# Patient Record
Sex: Male | Born: 1960 | Race: Black or African American | Hispanic: No | Marital: Single | Smoking: Former smoker
Health system: Southern US, Community
[De-identification: ages and names within clinical notes are randomized; demographics above are authoritative.]

## PROBLEM LIST (undated history)

## (undated) DIAGNOSIS — M1990 Unspecified osteoarthritis, unspecified site: Secondary | ICD-10-CM

## (undated) DIAGNOSIS — J45909 Unspecified asthma, uncomplicated: Secondary | ICD-10-CM

## (undated) DIAGNOSIS — A159 Respiratory tuberculosis unspecified: Secondary | ICD-10-CM

## (undated) DIAGNOSIS — I10 Essential (primary) hypertension: Secondary | ICD-10-CM

## (undated) HISTORY — DX: Respiratory tuberculosis unspecified: A15.9

## (undated) HISTORY — PX: OTHER SURGICAL HISTORY: SHX169

## (undated) HISTORY — PX: HERNIA REPAIR: SHX51

---

## 1997-07-20 ENCOUNTER — Emergency Department (HOSPITAL_COMMUNITY): Admission: EM | Admit: 1997-07-20 | Discharge: 1997-07-20 | Payer: Self-pay | Admitting: *Deleted

## 1997-07-30 ENCOUNTER — Emergency Department (HOSPITAL_COMMUNITY): Admission: EM | Admit: 1997-07-30 | Discharge: 1997-07-30 | Payer: Self-pay | Admitting: Emergency Medicine

## 1998-10-06 ENCOUNTER — Emergency Department (HOSPITAL_COMMUNITY): Admission: EM | Admit: 1998-10-06 | Discharge: 1998-10-06 | Payer: Self-pay | Admitting: Emergency Medicine

## 2001-10-15 ENCOUNTER — Emergency Department (HOSPITAL_COMMUNITY): Admission: EM | Admit: 2001-10-15 | Discharge: 2001-10-15 | Payer: Self-pay

## 2001-12-29 ENCOUNTER — Encounter: Payer: Self-pay | Admitting: Emergency Medicine

## 2001-12-29 ENCOUNTER — Emergency Department (HOSPITAL_COMMUNITY): Admission: EM | Admit: 2001-12-29 | Discharge: 2001-12-29 | Payer: Self-pay | Admitting: Emergency Medicine

## 2003-02-08 ENCOUNTER — Emergency Department (HOSPITAL_COMMUNITY): Admission: EM | Admit: 2003-02-08 | Discharge: 2003-02-08 | Payer: Self-pay | Admitting: Emergency Medicine

## 2003-12-18 ENCOUNTER — Emergency Department (HOSPITAL_COMMUNITY): Admission: EM | Admit: 2003-12-18 | Discharge: 2003-12-19 | Payer: Self-pay | Admitting: Emergency Medicine

## 2004-08-06 ENCOUNTER — Emergency Department (HOSPITAL_COMMUNITY): Admission: EM | Admit: 2004-08-06 | Discharge: 2004-08-06 | Payer: Self-pay | Admitting: Emergency Medicine

## 2004-10-02 ENCOUNTER — Emergency Department (HOSPITAL_COMMUNITY): Admission: EM | Admit: 2004-10-02 | Discharge: 2004-10-02 | Payer: Self-pay | Admitting: Emergency Medicine

## 2004-11-06 ENCOUNTER — Emergency Department (HOSPITAL_COMMUNITY): Admission: EM | Admit: 2004-11-06 | Discharge: 2004-11-06 | Payer: Self-pay | Admitting: Emergency Medicine

## 2004-11-20 ENCOUNTER — Emergency Department (HOSPITAL_COMMUNITY): Admission: EM | Admit: 2004-11-20 | Discharge: 2004-11-20 | Payer: Self-pay | Admitting: *Deleted

## 2007-10-09 ENCOUNTER — Emergency Department (HOSPITAL_COMMUNITY): Admission: EM | Admit: 2007-10-09 | Discharge: 2007-10-09 | Payer: Self-pay | Admitting: Emergency Medicine

## 2008-06-28 ENCOUNTER — Emergency Department (HOSPITAL_COMMUNITY): Admission: EM | Admit: 2008-06-28 | Discharge: 2008-06-28 | Payer: Self-pay | Admitting: Emergency Medicine

## 2008-12-18 ENCOUNTER — Emergency Department (HOSPITAL_COMMUNITY): Admission: EM | Admit: 2008-12-18 | Discharge: 2008-12-18 | Payer: Self-pay | Admitting: Emergency Medicine

## 2009-04-02 ENCOUNTER — Observation Stay (HOSPITAL_COMMUNITY): Admission: EM | Admit: 2009-04-02 | Discharge: 2009-04-03 | Payer: Self-pay | Admitting: Emergency Medicine

## 2010-01-30 ENCOUNTER — Emergency Department (HOSPITAL_COMMUNITY): Admission: EM | Admit: 2010-01-30 | Discharge: 2010-01-30 | Payer: Self-pay | Admitting: Emergency Medicine

## 2010-01-31 ENCOUNTER — Emergency Department (HOSPITAL_COMMUNITY): Admission: EM | Admit: 2010-01-31 | Discharge: 2010-02-01 | Payer: Self-pay | Admitting: Emergency Medicine

## 2010-06-05 LAB — CBC
HCT: 43.3 % (ref 39.0–52.0)
Hemoglobin: 15 g/dL (ref 13.0–17.0)
MCV: 91.3 fL (ref 78.0–100.0)
Platelets: 194 10*3/uL (ref 150–400)
WBC: 5.9 10*3/uL (ref 4.0–10.5)

## 2010-06-05 LAB — DIFFERENTIAL
Basophils Relative: 0 % (ref 0–1)
Eosinophils Relative: 1 % (ref 0–5)
Eosinophils Relative: 1 % (ref 0–5)
Lymphocytes Relative: 46 % (ref 12–46)
Lymphocytes Relative: 46 % (ref 12–46)
Lymphs Abs: 2.6 10*3/uL (ref 0.7–4.0)
Monocytes Absolute: 0.5 10*3/uL (ref 0.1–1.0)
Neutro Abs: 2.6 10*3/uL (ref 1.7–7.7)
Neutro Abs: 2.6 10*3/uL (ref 1.7–7.7)
Neutrophils Relative %: 46 % (ref 43–77)

## 2010-06-05 LAB — URINALYSIS, ROUTINE W REFLEX MICROSCOPIC
Bilirubin Urine: NEGATIVE
Glucose, UA: NEGATIVE mg/dL
Ketones, ur: NEGATIVE mg/dL
Nitrite: NEGATIVE
Specific Gravity, Urine: 1.024 (ref 1.005–1.030)
Urobilinogen, UA: 1 mg/dL (ref 0.0–1.0)
pH: 6 (ref 5.0–8.0)

## 2010-06-05 LAB — BASIC METABOLIC PANEL
BUN: 9 mg/dL (ref 6–23)
CO2: 25 mEq/L (ref 19–32)
GFR calc Af Amer: >60 mL/min (ref >60)
GFR calc non Af Amer: >60 mL/min (ref >60)
Glucose, Bld: 106 mg/dL — ABNORMAL HIGH (ref 70–99)
Potassium: 4.2 mEq/L (ref 3.5–5.1)
Sodium: 137 mEq/L (ref 135–145)

## 2010-06-05 LAB — RAPID URINE DRUG SCREEN, HOSP PERFORMED
Benzodiazepines: NOT DETECTED
Cocaine: POSITIVE — AB
Tetrahydrocannabinol: POSITIVE — AB

## 2010-06-10 LAB — GC/CHLAMYDIA PROBE AMP, GENITAL
Chlamydia, DNA Probe: NEGATIVE
GC Probe Amp, Genital: NEGATIVE

## 2010-06-10 LAB — CBC
HCT: 44.5 % (ref 39.0–52.0)
Hemoglobin: 15.3 g/dL (ref 13.0–17.0)
MCHC: 34.4 g/dL (ref 30.0–36.0)
MCV: 93.9 fL (ref 78.0–100.0)
Platelets: 153 K/uL (ref 150–400)
RBC: 4.74 MIL/uL (ref 4.22–5.81)
RDW: 13.3 % (ref 11.5–15.5)
WBC: 9.4 10*3/uL (ref 4.0–10.5)

## 2010-06-10 LAB — DIFFERENTIAL
Basophils Absolute: 0 K/uL (ref 0.0–0.1)
Basophils Relative: 0 % (ref 0–1)
Eosinophils Absolute: 0 K/uL (ref 0.0–0.7)
Eosinophils Relative: 0 % (ref 0–5)
Lymphocytes Relative: 11 % — ABNORMAL LOW (ref 12–46)
Lymphs Abs: 1 K/uL (ref 0.7–4.0)
Monocytes Absolute: 0.9 K/uL (ref 0.1–1.0)
Monocytes Relative: 9 % (ref 3–12)
Neutro Abs: 7.4 K/uL (ref 1.7–7.7)
Neutrophils Relative %: 79 % — ABNORMAL HIGH (ref 43–77)

## 2010-06-10 LAB — RAPID URINE DRUG SCREEN, HOSP PERFORMED
Amphetamines: NOT DETECTED
Barbiturates: NOT DETECTED
Benzodiazepines: NOT DETECTED
Cocaine: NOT DETECTED
Opiates: NOT DETECTED
Tetrahydrocannabinol: POSITIVE — AB

## 2010-06-10 LAB — BASIC METABOLIC PANEL
CO2: 24 mEq/L (ref 19–32)
Calcium: 8.8 mg/dL (ref 8.4–10.5)
Chloride: 104 mEq/L (ref 96–112)
Creatinine, Ser: 0.96 mg/dL (ref 0.4–1.5)
GFR calc Af Amer: >60 mL/min (ref >60)
GFR calc non Af Amer: >60 mL/min (ref >60)
Glucose, Bld: 107 mg/dL — ABNORMAL HIGH (ref 70–99)

## 2010-06-10 LAB — BASIC METABOLIC PANEL WITH GFR
BUN: 5 mg/dL — ABNORMAL LOW (ref 6–23)
Potassium: 3.4 meq/L — ABNORMAL LOW (ref 3.5–5.1)
Sodium: 136 meq/L (ref 135–145)

## 2010-06-10 LAB — URINE CULTURE
Colony Count: NO GROWTH
Culture: NO GROWTH

## 2010-06-10 LAB — URINALYSIS, ROUTINE W REFLEX MICROSCOPIC
Glucose, UA: NEGATIVE mg/dL
Ketones, ur: NEGATIVE mg/dL
Nitrite: NEGATIVE
Protein, ur: 100 mg/dL — AB
pH: 7.5 (ref 5.0–8.0)

## 2010-06-10 LAB — URINE MICROSCOPIC-ADD ON

## 2011-04-30 ENCOUNTER — Other Ambulatory Visit: Payer: Self-pay

## 2011-04-30 ENCOUNTER — Encounter (HOSPITAL_COMMUNITY): Payer: Self-pay | Admitting: *Deleted

## 2011-04-30 ENCOUNTER — Emergency Department (HOSPITAL_COMMUNITY): Payer: Self-pay

## 2011-04-30 ENCOUNTER — Observation Stay (HOSPITAL_COMMUNITY): Payer: Self-pay

## 2011-04-30 ENCOUNTER — Observation Stay (HOSPITAL_COMMUNITY)
Admission: EM | Admit: 2011-04-30 | Discharge: 2011-04-30 | Disposition: A | Discharge status: Home / Self Care | Payer: Self-pay | Attending: Emergency Medicine | Admitting: Emergency Medicine

## 2011-04-30 DIAGNOSIS — F17200 Nicotine dependence, unspecified, uncomplicated: Secondary | ICD-10-CM | POA: Insufficient documentation

## 2011-04-30 DIAGNOSIS — R079 Chest pain, unspecified: Principal | ICD-10-CM | POA: Insufficient documentation

## 2011-04-30 DIAGNOSIS — R0602 Shortness of breath: Secondary | ICD-10-CM | POA: Insufficient documentation

## 2011-04-30 LAB — CBC
HCT: 44.5 % (ref 39.0–52.0)
Hemoglobin: 15.1 g/dL (ref 13.0–17.0)
MCH: 31.5 pg (ref 26.0–34.0)
MCV: 92.7 fL (ref 78.0–100.0)
Platelets: 205 10*3/uL (ref 150–400)
RBC: 4.8 MIL/uL (ref 4.22–5.81)

## 2011-04-30 LAB — BASIC METABOLIC PANEL
BUN: 8 mg/dL (ref 6–23)
CO2: 24 mEq/L (ref 19–32)
Calcium: 9.4 mg/dL (ref 8.4–10.5)
Creatinine, Ser: 0.84 mg/dL (ref 0.50–1.35)
Glucose, Bld: 88 mg/dL (ref 70–99)

## 2011-04-30 MED ORDER — NITROGLYCERIN 0.4 MG SL SUBL
0.4000 mg | SUBLINGUAL_TABLET | SUBLINGUAL | Status: AC | PRN
  Administered 2011-04-30 (×3): 0.4 mg via SUBLINGUAL
  Filled 2011-04-30: qty 50
  Filled 2011-04-30: qty 25

## 2011-04-30 MED ORDER — IOHEXOL 350 MG/ML SOLN
80.0000 mL | Freq: Once | INTRAVENOUS | Status: AC | PRN
  Administered 2011-04-30: 80 mL via INTRAVENOUS

## 2011-04-30 MED ORDER — METOPROLOL TARTRATE 1 MG/ML IV SOLN
INTRAVENOUS | Status: AC
  Administered 2011-04-30: 5 mg
  Filled 2011-04-30: qty 10

## 2011-04-30 MED ORDER — ONDANSETRON HCL 4 MG/2ML IJ SOLN: 4.0000 mg | Freq: Once | INTRAMUSCULAR | Status: DC

## 2011-04-30 MED ORDER — ASPIRIN 81 MG PO CHEW
324.0000 mg | CHEWABLE_TABLET | Freq: Once | ORAL | Status: AC
  Administered 2011-04-30: 324 mg via ORAL
  Filled 2011-04-30: qty 4

## 2011-04-30 MED ORDER — MORPHINE SULFATE 4 MG/ML IJ SOLN
4.0000 mg | Freq: Once | INTRAMUSCULAR | Status: AC
  Administered 2011-04-30: 4 mg via INTRAVENOUS
  Filled 2011-04-30: qty 1

## 2011-04-30 MED ORDER — DIPHENHYDRAMINE HCL 50 MG/ML IJ SOLN
INTRAMUSCULAR | Status: AC
  Filled 2011-04-30: qty 1

## 2011-04-30 MED ORDER — DIPHENHYDRAMINE HCL 50 MG/ML IJ SOLN
25.0000 mg | Freq: Once | INTRAMUSCULAR | Status: AC
  Administered 2011-04-30: 25 mg via INTRAVENOUS

## 2011-04-30 MED ORDER — METOPROLOL TARTRATE 25 MG PO TABS
ORAL_TABLET | ORAL | Status: AC
  Administered 2011-04-30: 100 mg
  Filled 2011-04-30: qty 4

## 2011-04-30 MED ORDER — NITROGLYCERIN 0.4 MG SL SUBL
SUBLINGUAL_TABLET | SUBLINGUAL | Status: AC
  Administered 2011-04-30: 0.4 mg
  Filled 2011-04-30: qty 25

## 2011-04-30 NOTE — ED Notes (Signed)
Pt reports daily nausea and vomiting for the last several months. States yesterday while working out in the cold exerting himself he suddenly felt like someone had punched him in the chest. Described as heaviness on the left side. Pt reports he felt nauseous, dizzy and needing to sit down with the pain. Pt reports pain subsided with rest. Then this morning patient reported pain again came back as heaviness which woke him out of a sleep. Pt reports he also vomited this AM with the pain. Pt denies any abdominal pain. At present pain is 6/10 patient lying in bed.

## 2011-04-30 NOTE — ED Notes (Signed)
Returned from CT by RN.

## 2011-04-30 NOTE — ED Notes (Signed)
Patient transported to CT for CCTA by RN.

## 2011-04-30 NOTE — ED Notes (Signed)
BMI 24.4 

## 2011-04-30 NOTE — ED Provider Notes (Signed)
History     CSN: [id number]  Arrival date & time 04/30/11  1047   First MD Initiated Contact with Patient 04/30/11 1125      Chief Complaint  Patient presents with  . Chest Pain  . Shortness of Breath    (Consider location/radiation/quality/duration/timing/severity/associated sxs/prior treatment) HPI  Pt presents to the ED with complaints of chest pain that started yesterday at work, when he states "his nerves started to act up and he felt shakey while having the chest pain". The pain went away last night and then woke him up again this morning. After the pain continued to come and go throughout the day the patient decided to come to the emergency department to have it looked at. The patient denies a history of cardiac problems or previous workup. The patient is a smoker but denies hypertension and hyperlipidemia as well as diabetes. The patient describes the chest pain as burning mid chest. The patient denies any nausea outside of his normal, he states that for years he vomits every morning when he wakes up. Patient's EKG at triage is normal  History reviewed. No pertinent past medical history.  Past Surgical History  Procedure Date  . Hernia repair     No family history on file.  History  Substance Use Topics  . Smoking status: Current Everyday Smoker  . Smokeless tobacco: Not on file  . Alcohol Use: Yes     occ      Review of Systems  All other systems reviewed and are negative.    Allergies  Review of patient's allergies indicates no known allergies.  Home Medications  No current outpatient prescriptions on file.  BP 124/87  Pulse 81  Temp(Src) 98.1 F (36.7 C) (Oral)  Resp 16  SpO2 99%  Physical Exam  Nursing note and vitals reviewed. Constitutional: He is oriented to person, place, and time. He appears well-developed and well-nourished.  HENT:  Head: Normocephalic and atraumatic.  Eyes: EOM are normal. Pupils are equal, round, and reactive to light.    Neck: Normal range of motion.  Cardiovascular: Normal rate, regular rhythm and normal heart sounds.   Pulmonary/Chest: Effort normal.  Abdominal: Soft.  Musculoskeletal: Normal range of motion.  Neurological: He is alert and oriented to person, place, and time.  Skin: Skin is warm and dry.    ED Course  Procedures (including critical care time)   Labs Reviewed  CBC  BASIC METABOLIC PANEL  TROPONIN I   Chest Portable 1 View  04/30/2011  *RADIOLOGY REPORT*  Clinical Data: Smoker with mid chest pain.  PORTABLE CHEST - 1 VIEW 04/30/2011 1234 hours:  Comparison: None.  Findings: Cardiac silhouette normal and mediastinal contours unremarkable for the AP portable technique.  Lungs clear. Pulmonary vascularity normal.  Bronchovascular markings normal.  No pneumothorax.  No pleural effusions.  IMPRESSION: No acute cardiopulmonary disease.  Original Report Authenticated By: Arnell Sieving, M.D.     1. Chest pain       MDM    Date: 04/30/2011  Rate: 78  Rhythm: normal sinus rhythm  QRS Axis: normal  Intervals: normal  ST/T Wave abnormalities: normal  Conduction Disutrbances:left anterior fascicular block  Narrative Interpretation:   Old EKG Reviewed: none available  Repeat EKG   Date: 04/30/2011  Rate: 72  Rhythm: normal sinus rhythm  QRS Axis: left  Intervals: normal  ST/T Wave abnormalities: normal  Conduction Disutrbances:none  Narrative Interpretation:   Old EKG Reviewed: unchanged    Pt work up  so far is benign with a negative troponin, no acute findings on EKG or chest xray. The patient is pain free after 3 nitro and 324 of aspirin. Pt has low risk factor for cardiac event and does not meet requirements to be PERC negative due to his age however, suspicion is very low for a PE.   Pt discussed and evaluated by Dr. Hyman Hopes, and we both agree that patient is a good candidate for Chest Pain Protocol with a stress test in the AM. Pt is currently chest pain free. Pt  moved to CDU and care handed off to [facility], PA-C  Repeat Troponin scheduled for 3;00pm patient will need 6 hour Troponin at 6pm.          Dorthula Matas, PA 04/30/11 1525

## 2011-04-30 NOTE — ED Notes (Signed)
Anitra Lauth, MD signed EKGs

## 2011-04-30 NOTE — ED Notes (Signed)
Per Fayrene Helper, PA, pt may eat.

## 2011-04-30 NOTE — ED Notes (Signed)
Pt is here with chest pain and sob that started yesterday at work.  Went away last nite and then came back this am and the pain woke him up.

## 2011-04-30 NOTE — ED Notes (Signed)
Spoke w/Sarah, CT Tech, who advised cardiac CT can be performed today if provider chooses to do so.  Marlon Pel, PA, aware.

## 2011-04-30 NOTE — ED Notes (Signed)
Per [place], [place], change stress echo to cardiac CT.  Per Dr Amil Amen, administer Metoprolol 100mg  and will perform test today.

## 2011-04-30 NOTE — ED Provider Notes (Signed)
Assume patient care from Patrick Estrada, [place]. A 51 year old male currently in CDU for chest pain protocol. Plan to perform coronary CT for further evaluation. Patient is currently chest pain-free. First set of troponin is negative.  5:34 PM Patient is currently in no acute distress, and is chest pain-free. On exam, he is alert and oriented and in no acute distress. Heart is with regular rate and rhythm. Lungs with mild rhonchi heard diffusely but no wheezes or rales. Abdomen is soft and nontender. I spoke with radiologist who has performed a coronary CT scan. Patient has apparently nonobstructive coronary artery disease with a calciums score in the 20s and  is 61 percentile on SEMA scale.  I recommend for patient to follow up with the primary care Dr. for further management of his condition. It is likely that his chest pain is not related to his heart at this time. However time was spent discussing smoking cessation, although with taking baby aspirin on a daily basis. Patient was understanding and agree with plan.  Fayrene Helper, PA-C 04/30/11 [phone]

## 2011-04-30 NOTE — ED Provider Notes (Signed)
Medical screening examination/treatment/procedure(s) were performed by non-physician practitioner and as supervising physician I was immediately available for consultation/collaboration.   Loren Racer, MD 04/30/11 2351

## 2011-05-01 NOTE — ED Provider Notes (Signed)
Medical screening examination/treatment/procedure(s) were conducted as a shared visit with non-physician practitioner(s) and myself.  I personally evaluated the patient during the encounter  RRR. No chest pain during my evaluation. See PA note for full details. First set CE negative and CP free. To be moved to CDU under CP protocol.  Forbes Cellar, MD 05/01/11 [phone]

## 2011-10-12 ENCOUNTER — Encounter (HOSPITAL_COMMUNITY): Payer: Self-pay | Admitting: *Deleted

## 2011-10-12 ENCOUNTER — Emergency Department (HOSPITAL_COMMUNITY): Payer: Self-pay

## 2011-10-12 ENCOUNTER — Observation Stay (HOSPITAL_COMMUNITY)
Admission: EM | Admit: 2011-10-12 | Discharge: 2011-10-14 | Disposition: A | Discharge status: Home / Self Care | Payer: MEDICAID | Attending: General Surgery | Admitting: General Surgery

## 2011-10-12 DIAGNOSIS — G8310 Monoplegia of lower limb affecting unspecified side: Secondary | ICD-10-CM | POA: Diagnosis present

## 2011-10-12 DIAGNOSIS — R109 Unspecified abdominal pain: Secondary | ICD-10-CM

## 2011-10-12 DIAGNOSIS — N289 Disorder of kidney and ureter, unspecified: Secondary | ICD-10-CM | POA: Insufficient documentation

## 2011-10-12 DIAGNOSIS — I10 Essential (primary) hypertension: Secondary | ICD-10-CM

## 2011-10-12 DIAGNOSIS — W11XXXA Fall on and from ladder, initial encounter: Secondary | ICD-10-CM | POA: Insufficient documentation

## 2011-10-12 DIAGNOSIS — S20219A Contusion of unspecified front wall of thorax, initial encounter: Secondary | ICD-10-CM

## 2011-10-12 DIAGNOSIS — F1010 Alcohol abuse, uncomplicated: Secondary | ICD-10-CM | POA: Insufficient documentation

## 2011-10-12 DIAGNOSIS — S301XXA Contusion of abdominal wall, initial encounter: Principal | ICD-10-CM | POA: Insufficient documentation

## 2011-10-12 DIAGNOSIS — M549 Dorsalgia, unspecified: Secondary | ICD-10-CM | POA: Insufficient documentation

## 2011-10-12 DIAGNOSIS — W19XXXA Unspecified fall, initial encounter: Secondary | ICD-10-CM

## 2011-10-12 DIAGNOSIS — Z7289 Other problems related to lifestyle: Secondary | ICD-10-CM

## 2011-10-12 DIAGNOSIS — Z720 Tobacco use: Secondary | ICD-10-CM | POA: Insufficient documentation

## 2011-10-12 HISTORY — DX: Essential (primary) hypertension: I10

## 2011-10-12 LAB — CBC WITH DIFFERENTIAL/PLATELET
Basophils Relative: 0 % (ref 0–1)
Eosinophils Absolute: 0 10*3/uL (ref 0.0–0.7)
Hemoglobin: 14 g/dL (ref 13.0–17.0)
Lymphs Abs: 2.9 10*3/uL (ref 0.7–4.0)
MCH: 32.1 pg (ref 26.0–34.0)
MCHC: 34.1 g/dL (ref 30.0–36.0)
Neutro Abs: 2.1 10*3/uL (ref 1.7–7.7)
Neutrophils Relative %: 39 % — ABNORMAL LOW (ref 43–77)
Platelets: 214 10*3/uL (ref 150–400)
RBC: 4.36 MIL/uL (ref 4.22–5.81)

## 2011-10-12 LAB — POCT I-STAT, CHEM 8
Creatinine, Ser: 1.6 mg/dL — ABNORMAL HIGH (ref 0.50–1.35)
Glucose, Bld: 85 mg/dL (ref 70–99)
HCT: 44 % (ref 39.0–52.0)
Hemoglobin: 15 g/dL (ref 13.0–17.0)
TCO2: 21 mmol/L (ref 0–100)

## 2011-10-12 LAB — TYPE AND SCREEN
ABO/RH(D): O POS
Antibody Screen: NEGATIVE

## 2011-10-12 LAB — URINALYSIS, ROUTINE W REFLEX MICROSCOPIC
Bilirubin Urine: NEGATIVE
Leukocytes, UA: NEGATIVE
Nitrite: NEGATIVE
Specific Gravity, Urine: 1.01 (ref 1.005–1.030)
Urobilinogen, UA: 0.2 mg/dL (ref 0.0–1.0)
pH: 5.5 (ref 5.0–8.0)

## 2011-10-12 MED ORDER — SODIUM CHLORIDE 0.9 % IV SOLN
Freq: Once | INTRAVENOUS | Status: AC
  Administered 2011-10-12: 18:00:00 via INTRAVENOUS

## 2011-10-12 MED ORDER — MORPHINE SULFATE 4 MG/ML IJ SOLN
6.0000 mg | Freq: Once | INTRAMUSCULAR | Status: AC
  Administered 2011-10-12: 6 mg via INTRAVENOUS
  Filled 2011-10-12: qty 2

## 2011-10-12 NOTE — ED Notes (Signed)
Portable chest xray in progress.

## 2011-10-12 NOTE — ED Notes (Signed)
Pt states "I cannot move my legs, I can't feel them."  Dr. Ethelda Chick at bedside to assess pt.

## 2011-10-12 NOTE — ED Notes (Signed)
C-collar placed on pt.

## 2011-10-12 NOTE — Consult Note (Signed)
Reason for Consult:fall Referring Physician: [name] is an 51 y.o. male.  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  He was examined recently after he arrived, and was able to move all extremities. His main complaints were right chest and abdominal pain. CT head/cspine/abd/pelvis were negative,  but after being in the ED for a while, he told the nurse that he could not move his legs.  He was reexamined, and was seen to move legs with rectal exam and other distracting examinations, but would not move them willingly.  I saw him after he fell asleep and he was less verbal and less cooperative.  EtOH came back at 236.    Past Medical History  Diagnosis Date  . Hypertension 10/12/2011    Past Surgical History  Procedure Date  . Hernia repair     History reviewed. No pertinent family history.  Social History:  reports that he has been smoking.  He does not have any smokeless tobacco history on file. He reports that he drinks alcohol. He reports that he does not use illicit drugs.  Allergies:  Allergies  Allergen Reactions  . Morphine And Related Itching and Rash    burning    Medications: none known.  Results for orders placed during the hospital encounter of 10/12/11 (from the past 48 hour(s))  URINALYSIS, ROUTINE W REFLEX MICROSCOPIC     Status: Normal   Collection Time   10/12/11  5:45 PM      Component Value Range Comment   Color, Urine YELLOW  YELLOW    APPearance CLEAR  CLEAR    Specific Gravity, Urine 1.010  1.005 - 1.030    pH 5.5  5.0 - 8.0    Glucose, UA NEGATIVE  NEGATIVE mg/dL    Hgb urine dipstick NEGATIVE  NEGATIVE    Bilirubin Urine NEGATIVE  NEGATIVE    Ketones, ur NEGATIVE  NEGATIVE mg/dL    Protein, ur NEGATIVE  NEGATIVE mg/dL    Urobilinogen, UA 0.2  0.0 - 1.0 mg/dL    Nitrite NEGATIVE  NEGATIVE    Leukocytes, UA NEGATIVE  NEGATIVE MICROSCOPIC NOT DONE ON URINES WITH NEGATIVE PROTEIN, BLOOD, LEUKOCYTES, NITRITE, OR GLUCOSE <1000 mg/dL.  ABO/RH     Status: Normal   Collection Time   10/12/11  5:48 PM      Component Value Range Comment   ABO/RH(D) O POS     TYPE AND SCREEN     Status: Normal   Collection Time   10/12/11  6:12 PM      Component Value Range Comment   ABO/RH(D) O POS      Antibody Screen NEG      Sample Expiration 10/15/2011     CBC WITH DIFFERENTIAL     Status: Abnormal   Collection Time   10/12/11  6:13 PM      Component Value Range Comment   WBC 5.4  4.0 - 10.5 K/uL    RBC 4.36  4.22 - 5.81 MIL/uL    Hemoglobin 14.0  13.0 - 17.0 g/dL    HCT 63.0  16.0 - 10.9 %    MCV 94.3  78.0 - 100.0 fL    MCH 32.1  26.0 - 34.0 pg    MCHC 34.1  30.0 - 36.0  g/dL    RDW 09.8  11.9 - 14.7 %    Platelets 214  150 - 400 K/uL    Neutrophils Relative 39 (*) 43 - 77 %    Neutro Abs 2.1  1.7 - 7.7 K/uL    Lymphocytes Relative 53 (*) 12 - 46 %    Lymphs Abs 2.9  0.7 - 4.0 K/uL    Monocytes Relative 8  3 - 12 %    Monocytes Absolute 0.4  0.1 - 1.0 K/uL    Eosinophils Relative 0  0 - 5 %    Eosinophils Absolute 0.0  0.0 - 0.7 K/uL    Basophils Relative 0  0 - 1 %    Basophils Absolute 0.0  0.0 - 0.1 K/uL   POCT I-STAT, CHEM 8     Status: Abnormal   Collection Time   10/12/11  6:29 PM      Component Value Range Comment   Sodium 143  135 - 145 mEq/L    Potassium 3.9  3.5 - 5.1 mEq/L    Chloride 110  96 - 112 mEq/L    BUN 15  6 - 23 mg/dL    Creatinine, Ser 8.29 (*) 0.50 - 1.35 mg/dL    Glucose, Bld 85  70 - 99 mg/dL    Calcium, Ion 5.62  1.30 - 1.23 mmol/L    TCO2 21  0 - 100 mmol/L    Hemoglobin 15.0  13.0 - 17.0 g/dL    HCT 86.5  78.4 - 69.6 %   ETHANOL     Status: Abnormal   Collection Time   10/12/11  8:15 PM      Component Value Range Comment   Alcohol, Ethyl (B) 236 (*) 0 - 11 mg/dL     Dg Thoracic Spine 2 View  10/12/2011  *RADIOLOGY  REPORT*  Clinical Data: Fall.  Abdominal injury.  Unable to feel or move legs.  THORACIC SPINE - 2 VIEW  Comparison: Chest x-ray 10/12/2011, CT of the chest 04/30/2011  Findings: Alignment of the thoracic spine is normal.  No evidence for acute fracture.  No posterior rib fractures identified.  IMPRESSION: Negative exam.  Original Report Authenticated By: Patterson Hammersmith, M.D.   Dg Lumbar Spine Complete  10/12/2011  *RADIOLOGY REPORT*  Clinical Data: Fall.  Back pain.  Abdominal pain.  Unable to feel or move legs.  LUMBAR SPINE - COMPLETE 4+ VIEW  Comparison: CT of the abdomen and pelvis 10/12/2011  Findings: There are four non-rib bearing tubal bodies.  No evidence for acute fracture or subluxation.  Normal alignment.  Regional bowel gas pattern is nonobstructive.  There is residual contrast in the bladder following CT exam earlier tonight.  IMPRESSION: No evidence for acute  abnormality.  Original Report Authenticated By: Patterson Hammersmith, M.D.   Ct Head Wo Contrast  10/12/2011  *RADIOLOGY REPORT*  Clinical Data:  51 year old male status post fall with pain and headache.  CT HEAD WITHOUT CONTRAST CT CERVICAL SPINE WITHOUT CONTRAST  Technique:  Multidetector CT imaging of the head and cervical spine was performed following the standard protocol without intravenous contrast.  Multiplanar CT image reconstructions of the cervical spine were also generated.  Comparison:   None  CT HEAD  Findings: The no focal scalp hematoma. Visualized orbit soft tissues are within normal limits.  Minor paranasal sinus mucosal thickening.  Mastoids are clear.  Calvarium intact.  Cerebral volume is within normal limits for age.  No midline shift, ventriculomegaly,  mass effect, evidence of mass lesion, intracranial hemorrhage or evidence of cortically based acute infarction.  Gray-white matter differentiation is within normal limits throughout the brain.  IMPRESSION: 1. Normal noncontrast CT appearance of the brain. 2.   Cervical findings are below.  CT CERVICAL SPINE  Findings: Mild reversal of cervical lordosis. Visualized skull base is intact.  No atlanto-occipital dissociation.  Cervicothoracic junction alignment is within normal limits.  Bilateral posterior element alignment is within normal limits.  Congenital spinal canal narrowing.  Superimposed multilevel cervical disc disease.  Spinal stenosis at multiple levels overall appears mild to moderate.  No acute cervical fracture identified.  Lung apices are clear.  IMPRESSION: 1. No acute fracture or listhesis identified in the cervical spine. Ligamentous injury is not excluded. 2.  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  Original Report Authenticated By: Harley Hallmark, M.D.   Ct Cervical Spine Wo Contrast  10/12/2011  *RADIOLOGY REPORT*  Clinical Data:  51 year old male status post fall with pain and headache.  CT HEAD WITHOUT CONTRAST CT CERVICAL SPINE WITHOUT CONTRAST  Technique:  Multidetector CT imaging of the head and cervical spine was performed following the standard protocol without intravenous contrast.  Multiplanar CT image reconstructions of the cervical spine were also generated.  Comparison:   None  CT HEAD  Findings: The no focal scalp hematoma. Visualized orbit soft tissues are within normal limits.  Minor paranasal sinus mucosal thickening.  Mastoids are clear.  Calvarium intact.  Cerebral volume is within normal limits for age.  No midline shift, ventriculomegaly, mass effect, evidence of mass lesion, intracranial hemorrhage or evidence of cortically based acute infarction.  Gray-white matter differentiation is within normal limits throughout the brain.  IMPRESSION: 1. Normal noncontrast CT appearance of the brain. 2.  Cervical findings are below.  CT CERVICAL SPINE  Findings: Mild reversal of cervical lordosis. Visualized skull base is intact.  No atlanto-occipital dissociation.  Cervicothoracic junction alignment is within normal  limits.  Bilateral posterior element alignment is within normal limits.  Congenital spinal canal narrowing.  Superimposed multilevel cervical disc disease.  Spinal stenosis at multiple levels overall appears mild to moderate.  No acute cervical fracture identified.  Lung apices are clear.  IMPRESSION: 1. No acute fracture or listhesis identified in the cervical spine. Ligamentous injury is not excluded. 2.  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  Original Report Authenticated By: Harley Hallmark, M.D.   Ct Abdomen Pelvis W Contrast  10/12/2011  *RADIOLOGY REPORT*  Clinical Data: Larey Seat from ladder.  EtOH.  Back pain.  CT ABDOMEN AND PELVIS WITH CONTRAST  Technique:  Multidetector CT imaging of the abdomen and pelvis was performed following the standard protocol during bolus administration of intravenous contrast.  Contrast:  100 ml Omnipaque-300  Comparison: None.  Findings: The lung bases are negative.  No focal abnormality identified within the liver, spleen, pancreas, adrenal glands, or kidneys.  The gallbladder is present.  The stomach and small bowel loops have a normal appearance.  There are scattered colonic diverticula.  Otherwise, the colon has a normal appearance. The appendix is well seen and has a normal appearance.  The urinary bladder is distended.  No evidence for pneumothorax or acute fracture.  No evidence for aortic aneurysm. No retroperitoneal or mesenteric adenopathy.  IMPRESSION: No evidence for acute injury of the abdomen pelvis.  Original Report Authenticated By: Patterson Hammersmith, M.D.   Dg Chest Portable 1 View  10/12/2011  *RADIOLOGY REPORT*  Clinical Data: History  of fall with abdominal injury.  PORTABLE CHEST - 1 VIEW  Comparison: Chest x-ray 04/30/2011.  Findings: Lung volumes are normal.  No consolidative airspace disease.  No pleural effusions.  No pneumothorax.  No pulmonary nodule or mass noted.  Pulmonary vasculature and the cardiomediastinal silhouette are  within normal limits.  IMPRESSION: 1. No radiographic evidence of acute cardiopulmonary disease.  Original Report Authenticated By: Florencia Reasons, M.D.    Review of Systems  Unable to perform ROS: medical condition   Blood pressure 109/71, pulse 79, temperature 96.6 F (35.9 C), temperature source Oral, resp. rate 22, height 6\' 1"  (1.854 m), weight 187 lb (84.823 kg), SpO2 100.00%. Physical Exam  Nursing note and vitals reviewed. Constitutional: He appears well-developed and well-nourished. He is sleeping. No distress. Cervical collar in place.       Pt intoxicated.  Would not cooperate with history or exam.  Would not lay on back.  HENT:  Head: Normocephalic and atraumatic.  Right Ear: External ear normal.  Left Ear: External ear normal.  Eyes: Pupils are equal, round, and reactive to light. No scleral icterus.  Neck: Normal range of motion. Neck supple. No tracheal deviation present. No thyromegaly present.       In cervical collar  Cardiovascular: Normal rate, regular rhythm, normal heart sounds and intact distal pulses.   Respiratory: Effort normal and breath sounds normal. No respiratory distress. He has no wheezes. He has no rales.       Mild right chest wall tenderness   GI: Soft. Bowel sounds are normal. He exhibits no distension and no mass. There is no tenderness. There is no rebound and no guarding.  Genitourinary:       Normal rectal tone performed by ED  Lymphadenopathy:    He has no cervical adenopathy.  Neurological: He exhibits normal muscle tone. Coordination normal.       Pt stated that he "could not move legs," but flexed hip/knee with palpation of abdomen and flank.  Also withdrew to pain on nail bed.  Skin: Skin is warm and dry. No rash noted. He is not diaphoretic. No erythema. No pallor.  Psychiatric:       Intoxicated.  Uncooperative.     Assessment/Plan: Fall with complaints of difficulty moving lower extremities.   Await EtOH to wear off.   Suspect  that pt is feeling effects of alcohol intoxication.   Weakness of bilateral lower extremities.   ED to observe in bed several hours to see if he is more alert and able to move after this wears off.   Hydrate as pt has renal insufficiency compared to last Cr level.   Amar Keenum 10/12/2011, 11:21 PM

## 2011-10-12 NOTE — ED Notes (Signed)
Pt fell 10 feet and fell onto right side.  Pt has large right sided abd swelling with large abrasion to right hip.  Pt is intoxicated upon arrival to ED.

## 2011-10-12 NOTE — ED Notes (Signed)
Pt fell from approx 54ft onto wooden fence, gaurding rt flan/abd area, pt intoxicated, denies LOC

## 2011-10-12 NOTE — ED Provider Notes (Addendum)
History     CSN: [id number]  Arrival date & time 10/12/11  1723   First MD Initiated Contact with Patient 10/12/11 1751      Chief Complaint  Patient presents with  . Fall  . Abdominal Injury   level V caveat patient intoxicated. History obtained from EMS and from patient  (Consider location/radiation/quality/duration/timing/severity/associated sxs/prior treatment) HPI Patient fell from a height of approximately 10 feet from a ladder. Complains of right-sided chest pain and right-sided abdominal pain since the event he admits to drinking alcohol earlier today no other injury no loss of consciousness brought by EMS patient refused and all treatment while en route. History reviewed. No pertinent past medical history.  Past Surgical History  Procedure Date  . Hernia repair     History reviewed. No pertinent family history.  History  Substance Use Topics  . Smoking status: Current Everyday Smoker  . Smokeless tobacco: Not on file  . Alcohol Use: Yes     occ   Denies drug use    Review of Systems  Unable to perform ROS: Other  Cardiovascular: Positive for chest pain.  Gastrointestinal: Positive for abdominal pain.    Allergies  Morphine and related  Home Medications  No current outpatient prescriptions on file.  BP 122/77  Pulse 97  Temp 96.6 F (35.9 C) (Oral)  Resp 16  Ht 6\' 1"  (1.854 m)  Wt 187 lb (84.823 kg)  BMI 24.67 kg/m2  SpO2 94%  Physical Exam  Nursing note and vitals reviewed. Constitutional: He appears well-developed and well-nourished.       Mildly uncooperative with exam Glasgow Coma Score 15 appears intoxicated strong or alcohol and breath  HENT:  Head: Normocephalic and atraumatic.  Eyes: Conjunctivae are normal. Pupils are equal, round, and reactive to light.  Neck: Neck supple. No tracheal deviation present. No thyromegaly present.  Cardiovascular: Normal rate and regular rhythm.   No murmur heard. Pulmonary/Chest: Breath sounds  normal. He exhibits tenderness.       Tender over right anterior chest no crepitance or flail  Abdominal: Soft. Bowel sounds are normal. He exhibits no distension. There is tenderness.       Tender right abdomen nondistended no ecchymosis  Genitourinary: Rectum normal.       Normal tone no gross blood prostate normal  Musculoskeletal: Normal range of motion. He exhibits no edema and no tenderness.       Entire spine nontender pelvis stable all 4 extremities nontender neurovascularly intact  Neurological: He is alert. Coordination normal.       Glasgow Coma Score 15 moves all extremities  Skin: Skin is warm and dry. No rash noted.  Psychiatric: He has a normal mood and affect.    ED Course  Procedures (including critical care time) At 6:45 PM patient reports that he can't move his legs. He appears anxious. He will not move his legs voluntarily. Upon passively lifting either thigh, he holds his lower leg fully extended.  Labs Reviewed  URINALYSIS, ROUTINE W REFLEX MICROSCOPIC  CBC WITH DIFFERENTIAL  TYPE AND SCREEN   patient evaluated in the ED by Dr. Donell Beers  No results found. 11:50 PM patient moves all extremities. Attempted to ambulate. He could walk one or 2 steps with extreme difficulty, complaining of severe right-sided paralumbar pain 12 midnight patient medicated with morphine 6 mg IV followed by Benadryl 25 mg IV as he developed itching at IV site after morphine administered. Pain was improved he again attempted to ambulate. And  had severe pain in his right paralumbar area and was unable to cannulate more than one or 2 steps. Dr. Donell Beers was recontacted via telephone and we'll make arrangements for transfer to [facility], trauma service  No diagnosis found.    MDM  Plan 23 hour observation pain control. Itching after morphine administered felt to be histaminergic reaction rather than allery Diagnosis #1 fall #2 back pain #3 alcohol intoxication #4 renal  insufficiency   Doug Sou, MD 10/13/11 1610  Doug Sou, MD 10/13/11 [phone]

## 2011-10-13 LAB — CBC
MCH: 31.5 pg (ref 26.0–34.0)
MCV: 93.7 fL (ref 78.0–100.0)
Platelets: 211 10*3/uL (ref 150–400)
RDW: 13.6 % (ref 11.5–15.5)
WBC: 4.4 10*3/uL (ref 4.0–10.5)

## 2011-10-13 LAB — CREATININE, SERUM: Creatinine, Ser: 0.94 mg/dL (ref 0.50–1.35)

## 2011-10-13 MED ORDER — PANTOPRAZOLE SODIUM 40 MG IV SOLR
40.0000 mg | Freq: Every day | INTRAVENOUS | Status: DC
  Filled 2011-10-13 (×2): qty 40

## 2011-10-13 MED ORDER — DOCUSATE SODIUM 100 MG PO CAPS
100.0000 mg | ORAL_CAPSULE | Freq: Two times a day (BID) | ORAL | Status: DC
  Administered 2011-10-13: 100 mg via ORAL
  Filled 2011-10-13 (×2): qty 1

## 2011-10-13 MED ORDER — DIPHENHYDRAMINE HCL 50 MG/ML IJ SOLN
25.0000 mg | Freq: Once | INTRAMUSCULAR | Status: AC
  Administered 2011-10-13: 25 mg via INTRAVENOUS

## 2011-10-13 MED ORDER — KCL IN DEXTROSE-NACL 10-5-0.45 MEQ/L-%-% IV SOLN
INTRAVENOUS | Status: DC
  Administered 2011-10-13 – 2011-10-14 (×3): via INTRAVENOUS
  Filled 2011-10-13 (×6): qty 1000

## 2011-10-13 MED ORDER — ONDANSETRON HCL 4 MG PO TABS: 4.0000 mg | ORAL_TABLET | Freq: Four times a day (QID) | ORAL | Status: DC | PRN

## 2011-10-13 MED ORDER — ACETAMINOPHEN 325 MG PO TABS: 650.0000 mg | ORAL_TABLET | ORAL | Status: DC | PRN

## 2011-10-13 MED ORDER — BISACODYL 10 MG RE SUPP: 10.0000 mg | Freq: Every day | RECTAL | Status: DC | PRN

## 2011-10-13 MED ORDER — PANTOPRAZOLE SODIUM 40 MG PO TBEC
40.0000 mg | DELAYED_RELEASE_TABLET | Freq: Every day | ORAL | Status: DC
  Administered 2011-10-13: 40 mg via ORAL
  Filled 2011-10-13: qty 1

## 2011-10-13 MED ORDER — DIPHENHYDRAMINE HCL 50 MG/ML IJ SOLN
INTRAMUSCULAR | Status: AC
  Filled 2011-10-13: qty 1

## 2011-10-13 MED ORDER — ENOXAPARIN SODIUM 40 MG/0.4ML ~~LOC~~ SOLN
40.0000 mg | SUBCUTANEOUS | Status: DC
  Filled 2011-10-13: qty 0.4

## 2011-10-13 MED ORDER — MORPHINE SULFATE 2 MG/ML IJ SOLN
1.0000 mg | INTRAMUSCULAR | Status: DC | PRN
  Administered 2011-10-13: 4 mg via INTRAVENOUS
  Filled 2011-10-13: qty 2

## 2011-10-13 MED ORDER — HYDROCODONE-ACETAMINOPHEN 5-325 MG PO TABS: 1.0000 | ORAL_TABLET | ORAL | Status: DC | PRN

## 2011-10-13 MED ORDER — ONDANSETRON HCL 4 MG/2ML IJ SOLN: 4.0000 mg | Freq: Four times a day (QID) | INTRAMUSCULAR | Status: DC | PRN

## 2011-10-13 MED ORDER — HYDROCODONE-ACETAMINOPHEN 5-325 MG PO TABS
2.0000 | ORAL_TABLET | ORAL | Status: DC | PRN
  Administered 2011-10-13: 2 via ORAL
  Filled 2011-10-13 (×4): qty 2

## 2011-10-13 NOTE — Progress Notes (Signed)
Patient ID: Patrick Estrada, male   DOB: [birth year], 51 y.o.   MRN: [id number] [facility] Surgery Progress Note:   * No surgery found *  Subjective: Mental status is clear.  Complaining of pain particularly in his right PSIS region.   Objective: Vital signs in last 24 hours: Temp:  [96.6 F (35.9 C)-97.6 F (36.4 C)] 97.5 F (36.4 C) (07/21 0555) Pulse Rate:  [60-97] 63  (07/21 0555) Resp:  [16-24] 18  (07/21 0555) BP: (108-136)/(67-80) 117/80 mmHg (07/21 0555) SpO2:  [94 %-100 %] 97 % (07/21 0555) Weight:  [162 lb 6.4 oz (73.664 kg)-187 lb (84.823 kg)] 162 lb 6.4 oz (73.664 kg) (07/21 0348)  Intake/Output from previous day: 07/20 0701 - 07/21 0700 In: -  Out: 2850 [Urine:2850] Intake/Output this shift:    Physical Exam: Work of breathing is  Normal.  Cervical collar in place.  Abdomen nontender.  Chest BS equal  Lab Results:  Results for orders placed during the hospital encounter of 10/12/11 (from the past 48 hour(s))  URINALYSIS, ROUTINE W REFLEX MICROSCOPIC     Status: Normal   Collection Time   10/12/11  5:45 PM      Component Value Range Comment   Color, Urine YELLOW  YELLOW    APPearance CLEAR  CLEAR    Specific Gravity, Urine 1.010  1.005 - 1.030    pH 5.5  5.0 - 8.0    Glucose, UA NEGATIVE  NEGATIVE mg/dL    Hgb urine dipstick NEGATIVE  NEGATIVE    Bilirubin Urine NEGATIVE  NEGATIVE    Ketones, ur NEGATIVE  NEGATIVE mg/dL    Protein, ur NEGATIVE  NEGATIVE mg/dL    Urobilinogen, UA 0.2  0.0 - 1.0 mg/dL    Nitrite NEGATIVE  NEGATIVE    Leukocytes, UA NEGATIVE  NEGATIVE MICROSCOPIC NOT DONE ON URINES WITH NEGATIVE PROTEIN, BLOOD, LEUKOCYTES, NITRITE, OR GLUCOSE <1000 mg/dL.  ABO/RH     Status: Normal   Collection Time   10/12/11  5:48 PM      Component Value Range Comment   ABO/RH(D) O POS     TYPE AND SCREEN     Status: Normal   Collection Time   10/12/11  6:12 PM      Component Value Range Comment   ABO/RH(D) O POS      Antibody Screen NEG      Sample  Expiration 10/15/2011     CBC WITH DIFFERENTIAL     Status: Abnormal   Collection Time   10/12/11  6:13 PM      Component Value Range Comment   WBC 5.4  4.0 - 10.5 K/uL    RBC 4.36  4.22 - 5.81 MIL/uL    Hemoglobin 14.0  13.0 - 17.0 g/dL    HCT 78.2  95.6 - 21.3 %    MCV 94.3  78.0 - 100.0 fL    MCH 32.1  26.0 - 34.0 pg    MCHC 34.1  30.0 - 36.0 g/dL    RDW 08.6  57.8 - 46.9 %    Platelets 214  150 - 400 K/uL    Neutrophils Relative 39 (*) 43 - 77 %    Neutro Abs 2.1  1.7 - 7.7 K/uL    Lymphocytes Relative 53 (*) 12 - 46 %    Lymphs Abs 2.9  0.7 - 4.0 K/uL    Monocytes Relative 8  3 - 12 %    Monocytes Absolute 0.4  0.1 - 1.0 K/uL  Eosinophils Relative 0  0 - 5 %    Eosinophils Absolute 0.0  0.0 - 0.7 K/uL    Basophils Relative 0  0 - 1 %    Basophils Absolute 0.0  0.0 - 0.1 K/uL   POCT I-STAT, CHEM 8     Status: Abnormal   Collection Time   10/12/11  6:29 PM      Component Value Range Comment   Sodium 143  135 - 145 mEq/L    Potassium 3.9  3.5 - 5.1 mEq/L    Chloride 110  96 - 112 mEq/L    BUN 15  6 - 23 mg/dL    Creatinine, Ser 1.02 (*) 0.50 - 1.35 mg/dL    Glucose, Bld 85  70 - 99 mg/dL    Calcium, Ion 7.25  3.66 - 1.23 mmol/L    TCO2 21  0 - 100 mmol/L    Hemoglobin 15.0  13.0 - 17.0 g/dL    HCT 44.0  34.7 - 42.5 %   ETHANOL     Status: Abnormal   Collection Time   10/12/11  8:15 PM      Component Value Range Comment   Alcohol, Ethyl (B) 236 (*) 0 - 11 mg/dL   CBC     Status: Normal   Collection Time   10/13/11  7:10 AM      Component Value Range Comment   WBC 4.4  4.0 - 10.5 K/uL    RBC 4.47  4.22 - 5.81 MIL/uL    Hemoglobin 14.1  13.0 - 17.0 g/dL    HCT 95.6  38.7 - 56.4 %    MCV 93.7  78.0 - 100.0 fL    MCH 31.5  26.0 - 34.0 pg    MCHC 33.7  30.0 - 36.0 g/dL    RDW 33.2  95.1 - 88.4 %    Platelets 211  150 - 400 K/uL   CREATININE, SERUM     Status: Normal   Collection Time   10/13/11  7:10 AM      Component Value Range Comment   Creatinine, Ser 0.94   0.50 - 1.35 mg/dL    GFR calc non Af Amer >90  >90 mL/min    GFR calc Af Amer >90  >90 mL/min     Radiology/Results: Dg Thoracic Spine 2 View  10/12/2011  *RADIOLOGY REPORT*  Clinical Data: Fall.  Abdominal injury.  Unable to feel or move legs.  THORACIC SPINE - 2 VIEW  Comparison: Chest x-ray 10/12/2011, CT of the chest 04/30/2011  Findings: Alignment of the thoracic spine is normal.  No evidence for acute fracture.  No posterior rib fractures identified.  IMPRESSION: Negative exam.  Original Report Authenticated By: Patterson Hammersmith, M.D.   Dg Lumbar Spine Complete  10/12/2011  *RADIOLOGY REPORT*  Clinical Data: Fall.  Back pain.  Abdominal pain.  Unable to feel or move legs.  LUMBAR SPINE - COMPLETE 4+ VIEW  Comparison: CT of the abdomen and pelvis 10/12/2011  Findings: There are four non-rib bearing tubal bodies.  No evidence for acute fracture or subluxation.  Normal alignment.  Regional bowel gas pattern is nonobstructive.  There is residual contrast in the bladder following CT exam earlier tonight.  IMPRESSION: No evidence for acute  abnormality.  Original Report Authenticated By: Patterson Hammersmith, M.D.   Ct Head Wo Contrast  10/12/2011  *RADIOLOGY REPORT*  Clinical Data:  51 year old male status post fall with pain and headache.  CT HEAD  WITHOUT CONTRAST CT CERVICAL SPINE WITHOUT CONTRAST  Technique:  Multidetector CT imaging of the head and cervical spine was performed following the standard protocol without intravenous contrast.  Multiplanar CT image reconstructions of the cervical spine were also generated.  Comparison:   None  CT HEAD  Findings: The no focal scalp hematoma. Visualized orbit soft tissues are within normal limits.  Minor paranasal sinus mucosal thickening.  Mastoids are clear.  Calvarium intact.  Cerebral volume is within normal limits for age.  No midline shift, ventriculomegaly, mass effect, evidence of mass lesion, intracranial hemorrhage or evidence of cortically based  acute infarction.  Gray-white matter differentiation is within normal limits throughout the brain.  IMPRESSION: 1. Normal noncontrast CT appearance of the brain. 2.  Cervical findings are below.  CT CERVICAL SPINE  Findings: Mild reversal of cervical lordosis. Visualized skull base is intact.  No atlanto-occipital dissociation.  Cervicothoracic junction alignment is within normal limits.  Bilateral posterior element alignment is within normal limits.  Congenital spinal canal narrowing.  Superimposed multilevel cervical disc disease.  Spinal stenosis at multiple levels overall appears mild to moderate.  No acute cervical fracture identified.  Lung apices are clear.  IMPRESSION: 1. No acute fracture or listhesis identified in the cervical spine. Ligamentous injury is not excluded. 2.  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  Original Report Authenticated By: Harley Hallmark, M.D.   Ct Cervical Spine Wo Contrast  10/12/2011  *RADIOLOGY REPORT*  Clinical Data:  51 year old male status post fall with pain and headache.  CT HEAD WITHOUT CONTRAST CT CERVICAL SPINE WITHOUT CONTRAST  Technique:  Multidetector CT imaging of the head and cervical spine was performed following the standard protocol without intravenous contrast.  Multiplanar CT image reconstructions of the cervical spine were also generated.  Comparison:   None  CT HEAD  Findings: The no focal scalp hematoma. Visualized orbit soft tissues are within normal limits.  Minor paranasal sinus mucosal thickening.  Mastoids are clear.  Calvarium intact.  Cerebral volume is within normal limits for age.  No midline shift, ventriculomegaly, mass effect, evidence of mass lesion, intracranial hemorrhage or evidence of cortically based acute infarction.  Gray-white matter differentiation is within normal limits throughout the brain.  IMPRESSION: 1. Normal noncontrast CT appearance of the brain. 2.  Cervical findings are below.  CT CERVICAL SPINE   Findings: Mild reversal of cervical lordosis. Visualized skull base is intact.  No atlanto-occipital dissociation.  Cervicothoracic junction alignment is within normal limits.  Bilateral posterior element alignment is within normal limits.  Congenital spinal canal narrowing.  Superimposed multilevel cervical disc disease.  Spinal stenosis at multiple levels overall appears mild to moderate.  No acute cervical fracture identified.  Lung apices are clear.  IMPRESSION: 1. No acute fracture or listhesis identified in the cervical spine. Ligamentous injury is not excluded. 2.  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  Original Report Authenticated By: Harley Hallmark, M.D.   Ct Abdomen Pelvis W Contrast  10/12/2011  *RADIOLOGY REPORT*  Clinical Data: Larey Seat from ladder.  EtOH.  Back pain.  CT ABDOMEN AND PELVIS WITH CONTRAST  Technique:  Multidetector CT imaging of the abdomen and pelvis was performed following the standard protocol during bolus administration of intravenous contrast.  Contrast:  100 ml Omnipaque-300  Comparison: None.  Findings: The lung bases are negative.  No focal abnormality identified within the liver, spleen, pancreas, adrenal glands, or kidneys.  The gallbladder is present.  The stomach and small bowel  loops have a normal appearance.  There are scattered colonic diverticula.  Otherwise, the colon has a normal appearance. The appendix is well seen and has a normal appearance.  The urinary bladder is distended.  No evidence for pneumothorax or acute fracture.  No evidence for aortic aneurysm. No retroperitoneal or mesenteric adenopathy.  IMPRESSION: No evidence for acute injury of the abdomen pelvis.  Original Report Authenticated By: Patterson Hammersmith, M.D.   Dg Chest Portable 1 View  10/12/2011  *RADIOLOGY REPORT*  Clinical Data: History of fall with abdominal injury.  PORTABLE CHEST - 1 VIEW  Comparison: Chest x-ray 04/30/2011.  Findings: Lung volumes are normal.  No  consolidative airspace disease.  No pleural effusions.  No pneumothorax.  No pulmonary nodule or mass noted.  Pulmonary vasculature and the cardiomediastinal silhouette are within normal limits.  IMPRESSION: 1. No radiographic evidence of acute cardiopulmonary disease.  Original Report Authenticated By: Florencia Reasons, M.D.    Anti-infectives: Anti-infectives    None      Assessment/Plan: Problem List: Patient Active Problem List  Diagnosis  (none) - all problems resolved or deleted    Fall onto solid fences with musculoskeletal contusion.  No apparent bony injury Ethanolism  Will D/C cervical collar Get up and walk today. * No surgery found *    LOS: 1 day   Matt B. Daphine Deutscher, MD, [facility] [phone] beeper [phone]  10/13/2011 8:37 AM

## 2011-10-13 NOTE — ED Notes (Signed)
Opened chart to update EMS and nurse

## 2011-10-14 ENCOUNTER — Telehealth: Payer: Self-pay | Admitting: Orthopedic Surgery

## 2011-10-14 DIAGNOSIS — W11XXXA Fall on and from ladder, initial encounter: Secondary | ICD-10-CM | POA: Diagnosis present

## 2011-10-14 DIAGNOSIS — Z7289 Other problems related to lifestyle: Secondary | ICD-10-CM

## 2011-10-14 DIAGNOSIS — G8310 Monoplegia of lower limb affecting unspecified side: Secondary | ICD-10-CM | POA: Diagnosis present

## 2011-10-14 DIAGNOSIS — S301XXA Contusion of abdominal wall, initial encounter: Secondary | ICD-10-CM | POA: Diagnosis present

## 2011-10-14 MED ORDER — PNEUMOCOCCAL VAC POLYVALENT 25 MCG/0.5ML IJ INJ: 0.5000 mL | INJECTION | INTRAMUSCULAR | Status: DC

## 2011-10-14 MED ORDER — HYDROCODONE-ACETAMINOPHEN 5-325 MG PO TABS: 1.0000 | ORAL_TABLET | ORAL | Status: DC | PRN

## 2011-10-14 MED ORDER — METHOCARBAMOL 500 MG PO TABS: 500.0000 mg | ORAL_TABLET | Freq: Four times a day (QID) | ORAL | Status: AC | PRN

## 2011-10-14 MED ORDER — NAPROXEN 500 MG PO TABS: 500.0000 mg | ORAL_TABLET | Freq: Two times a day (BID) | ORAL | Status: AC

## 2011-10-14 MED ORDER — TRAMADOL HCL 50 MG PO TABS: 50.0000 mg | ORAL_TABLET | Freq: Four times a day (QID) | ORAL | Status: AC | PRN

## 2011-10-14 NOTE — Progress Notes (Signed)
Patient ID: Patrick Estrada, male   DOB: 13-Apr-1960, 51 y.o.   MRN: [id number]   LOS: 2 days   Subjective: C/o right-sided abdominal pain. Denies N/V but little appetite. Leg weakness has resolved.  Objective: Vital signs in last 24 hours: Temp:  [98 F (36.7 C)] 98 F (36.7 C) (07/22 0610) Pulse Rate:  [49] 49  (07/22 0610) Resp:  [18] 18  (07/22 0610) BP: (122)/(74) 122/74 mmHg (07/22 0610) SpO2:  [99 %] 99 % (07/22 0610) Last BM Date: 10/12/11  Lab Results:  BMET  Basename 10/14/11 0535 10/13/11 0710 10/12/11 1829  NA -- -- 143  K -- -- 3.9  CL -- -- 110  CO2 -- -- --  GLUCOSE -- -- 85  BUN -- -- 15  CREATININE 0.97 0.94 --  CALCIUM -- -- --    General appearance: alert and no distress Resp: clear to auscultation bilaterally Cardio: regular rate and rhythm GI: Soft, moderate TTP, RUQ/RLQ, +BS Extremities: NVI   Assessment/Plan: Fall BLE paresis -- Resolved Abd wall contusion EtOH Dispo -- Home   Freeman Caldron, PA-C Pager: [phone] General Trauma PA Pager: [phone]   10/14/2011

## 2011-10-14 NOTE — Discharge Summary (Signed)
This patient has been seen and I agree with the findings and treatment plan.  Sebastyan Snodgrass O. Kelsie Zaborowski, III, MD, FACS [phone] (pager) [phone] (direct pager) Trauma Surgeon  

## 2011-10-14 NOTE — Progress Notes (Signed)
UR complete 

## 2011-10-14 NOTE — Progress Notes (Signed)
Pt discharged to home with family. NSL discontinued discharge instruction explain pt verbalized understanding

## 2011-10-14 NOTE — Progress Notes (Signed)
This patient has been seen and I agree with the findings and treatment plan.  Larence Thone O. Mamye Bolds, III, MD, FACS [phone] (pager) [phone] (direct pager) Trauma Surgeon  
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
## 2011-10-14 NOTE — Telephone Encounter (Signed)
Left message with woman who answered phone to have him call me back.

## 2011-10-15 ENCOUNTER — Emergency Department (HOSPITAL_COMMUNITY): Payer: Self-pay

## 2011-10-15 ENCOUNTER — Emergency Department (HOSPITAL_COMMUNITY)
Admission: EM | Admit: 2011-10-15 | Discharge: 2011-10-15 | Disposition: A | Discharge status: Home / Self Care | Payer: Self-pay | Attending: Emergency Medicine | Admitting: Emergency Medicine

## 2011-10-15 ENCOUNTER — Encounter (HOSPITAL_COMMUNITY): Payer: Self-pay | Admitting: Radiology

## 2011-10-15 ENCOUNTER — Telehealth (INDEPENDENT_AMBULATORY_CARE_PROVIDER_SITE_OTHER): Payer: Self-pay | Admitting: General Surgery

## 2011-10-15 DIAGNOSIS — W3400XA Accidental discharge from unspecified firearms or gun, initial encounter: Secondary | ICD-10-CM | POA: Insufficient documentation

## 2011-10-15 DIAGNOSIS — K7689 Other specified diseases of liver: Secondary | ICD-10-CM | POA: Insufficient documentation

## 2011-10-15 DIAGNOSIS — M549 Dorsalgia, unspecified: Secondary | ICD-10-CM | POA: Insufficient documentation

## 2011-10-15 DIAGNOSIS — F1010 Alcohol abuse, uncomplicated: Secondary | ICD-10-CM | POA: Insufficient documentation

## 2011-10-15 DIAGNOSIS — S41009A Unspecified open wound of unspecified shoulder, initial encounter: Secondary | ICD-10-CM | POA: Insufficient documentation

## 2011-10-15 DIAGNOSIS — F29 Unspecified psychosis not due to a substance or known physiological condition: Secondary | ICD-10-CM | POA: Insufficient documentation

## 2011-10-15 DIAGNOSIS — R062 Wheezing: Secondary | ICD-10-CM | POA: Insufficient documentation

## 2011-10-15 DIAGNOSIS — Z1810 Retained metal fragments, unspecified: Secondary | ICD-10-CM | POA: Insufficient documentation

## 2011-10-15 DIAGNOSIS — M25519 Pain in unspecified shoulder: Secondary | ICD-10-CM | POA: Insufficient documentation

## 2011-10-15 DIAGNOSIS — F10929 Alcohol use, unspecified with intoxication, unspecified: Secondary | ICD-10-CM

## 2011-10-15 LAB — CBC
MCH: 31.9 pg (ref 26.0–34.0)
MCHC: 34.3 g/dL (ref 30.0–36.0)
MCV: 93 fL (ref 78.0–100.0)
Platelets: 225 10*3/uL (ref 150–400)
RDW: 13.3 % (ref 11.5–15.5)

## 2011-10-15 LAB — TYPE AND SCREEN
ABO/RH(D): O POS
Antibody Screen: NEGATIVE

## 2011-10-15 LAB — CK
Total CK: 1864 U/L — ABNORMAL HIGH (ref 7–232)
Total CK: 1691 U/L — ABNORMAL HIGH (ref 7–232)

## 2011-10-15 LAB — COMPREHENSIVE METABOLIC PANEL
AST: 90 U/L — ABNORMAL HIGH (ref 0–37)
Albumin: 3.9 g/dL (ref 3.5–5.2)
CO2: 22 mEq/L (ref 19–32)
Calcium: 9.2 mg/dL (ref 8.4–10.5)
Creatinine, Ser: 0.92 mg/dL (ref 0.50–1.35)
GFR calc non Af Amer: >90 mL/min (ref >90)
Total Protein: 7.7 g/dL (ref 6.0–8.3)

## 2011-10-15 LAB — PROTIME-INR: INR: 1.1 (ref 0.00–1.49)

## 2011-10-15 MED ORDER — CEFAZOLIN SODIUM 1-5 GM-% IV SOLN
1.0000 g | Freq: Once | INTRAVENOUS | Status: AC
  Administered 2011-10-15: 1 g via INTRAVENOUS
  Filled 2011-10-15: qty 50

## 2011-10-15 MED ORDER — FENTANYL CITRATE 0.05 MG/ML IJ SOLN
100.0000 ug | Freq: Once | INTRAMUSCULAR | Status: AC
  Administered 2011-10-15: 100 ug via INTRAVENOUS
  Filled 2011-10-15: qty 2

## 2011-10-15 MED ORDER — SODIUM CHLORIDE 0.9 % IV BOLUS (SEPSIS)
1000.0000 mL | Freq: Once | INTRAVENOUS | Status: AC
  Administered 2011-10-15: 1000 mL via INTRAVENOUS

## 2011-10-15 MED ORDER — HYDROCODONE-ACETAMINOPHEN 5-325 MG PO TABS: 1.0000 | ORAL_TABLET | ORAL | Status: AC | PRN

## 2011-10-15 MED ORDER — TETANUS-DIPHTH-ACELL PERTUSSIS 5-2.5-18.5 LF-MCG/0.5 IM SUSP
0.5000 mL | Freq: Once | INTRAMUSCULAR | Status: AC
  Administered 2011-10-15: 0.5 mL via INTRAMUSCULAR
  Filled 2011-10-15 (×2): qty 0.5

## 2011-10-15 MED ORDER — IOHEXOL 300 MG/ML  SOLN
100.0000 mL | Freq: Once | INTRAMUSCULAR | Status: AC | PRN
  Administered 2011-10-15: 100 mL via INTRAVENOUS

## 2011-10-15 MED ORDER — SODIUM CHLORIDE 0.9 % IV SOLN
Freq: Once | INTRAVENOUS | Status: AC
  Administered 2011-10-15: 18:00:00 via INTRAVENOUS

## 2011-10-15 NOTE — ED Notes (Signed)
[employer] at bedside speaking to patient  
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
## 2011-10-15 NOTE — ED Notes (Signed)
Pt discharged home in good condition with friends.  
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
## 2011-10-15 NOTE — Progress Notes (Signed)
Orthopedic Tech Progress Note Patient Details:  Patrick Estrada 04-22-1960 [id number]  Ortho Devices Type of Ortho Device: Arm foam sling Ortho Device/Splint Location: (L) UE Ortho Device/Splint Interventions: Application   Jennye Moccasin 10/15/2011, 4:36 PM

## 2011-10-15 NOTE — ED Provider Notes (Signed)
Resting comfortably. Plan in CDU was to hydrate and recheck total CK, which is trending downward. Trauma surgery paged to arrange follow up in Trauma Clinic. Anticipate d/c home.  Rodena Medin, PA-C 10/15/11 2139

## 2011-10-15 NOTE — ED Notes (Signed)
Patient is resting comfortably. 
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
## 2011-10-16 NOTE — ED Provider Notes (Signed)
Medical screening examination/treatment/procedure(s) were conducted as a shared visit with non-physician practitioner(s) and myself.  I personally evaluated the patient during the encounter  Cheri Guppy, MD 10/16/11 [phone]

## 2012-12-10 ENCOUNTER — Emergency Department (HOSPITAL_COMMUNITY)
Admission: EM | Admit: 2012-12-10 | Discharge: 2012-12-11 | Disposition: A | Discharge status: Other Acute Inpatient Hospital | Payer: Self-pay | Attending: Emergency Medicine | Admitting: Emergency Medicine

## 2012-12-10 ENCOUNTER — Encounter (HOSPITAL_COMMUNITY): Payer: Self-pay | Admitting: Emergency Medicine

## 2012-12-10 DIAGNOSIS — R1110 Vomiting, unspecified: Secondary | ICD-10-CM | POA: Insufficient documentation

## 2012-12-10 DIAGNOSIS — F1010 Alcohol abuse, uncomplicated: Secondary | ICD-10-CM | POA: Insufficient documentation

## 2012-12-10 DIAGNOSIS — F17200 Nicotine dependence, unspecified, uncomplicated: Secondary | ICD-10-CM | POA: Insufficient documentation

## 2012-12-10 DIAGNOSIS — Z8739 Personal history of other diseases of the musculoskeletal system and connective tissue: Secondary | ICD-10-CM | POA: Insufficient documentation

## 2012-12-10 DIAGNOSIS — R197 Diarrhea, unspecified: Secondary | ICD-10-CM | POA: Insufficient documentation

## 2012-12-10 DIAGNOSIS — F1410 Cocaine abuse, uncomplicated: Secondary | ICD-10-CM | POA: Insufficient documentation

## 2012-12-10 HISTORY — DX: Unspecified osteoarthritis, unspecified site: M19.90

## 2012-12-10 LAB — CBC
HCT: 47.8 % (ref 39.0–52.0)
MCV: 90.9 fL (ref 78.0–100.0)
Platelets: 226 10*3/uL (ref 150–400)
RBC: 5.26 MIL/uL (ref 4.22–5.81)
WBC: 5.3 10*3/uL (ref 4.0–10.5)

## 2012-12-10 LAB — RAPID URINE DRUG SCREEN, HOSP PERFORMED
Amphetamines: NOT DETECTED
Barbiturates: NOT DETECTED

## 2012-12-10 LAB — COMPREHENSIVE METABOLIC PANEL
AST: 86 U/L — ABNORMAL HIGH (ref 0–37)
Alkaline Phosphatase: 47 U/L (ref 39–117)
BUN: 14 mg/dL (ref 6–23)
CO2: 22 mEq/L (ref 19–32)
Chloride: 102 mEq/L (ref 96–112)
Creatinine, Ser: 0.98 mg/dL (ref 0.50–1.35)
GFR calc non Af Amer: >90 mL/min (ref >90)
Total Bilirubin: 0.4 mg/dL (ref 0.3–1.2)

## 2012-12-10 LAB — SALICYLATE LEVEL: Salicylate Lvl: <2 mg/dL — ABNORMAL LOW (ref 2.8–20.0)

## 2012-12-10 MED ORDER — ALBUTEROL SULFATE HFA 108 (90 BASE) MCG/ACT IN AERS: 2.0000 | INHALATION_SPRAY | RESPIRATORY_TRACT | Status: DC | PRN

## 2012-12-10 MED ORDER — VITAMIN B-1 100 MG PO TABS
100.0000 mg | ORAL_TABLET | Freq: Every day | ORAL | Status: DC
  Administered 2012-12-10 – 2012-12-11 (×2): 100 mg via ORAL
  Filled 2012-12-10 (×2): qty 1

## 2012-12-10 MED ORDER — ALUM & MAG HYDROXIDE-SIMETH 200-200-20 MG/5ML PO SUSP: 30.0000 mL | ORAL | Status: DC | PRN

## 2012-12-10 MED ORDER — ZOLPIDEM TARTRATE 5 MG PO TABS: 5.0000 mg | ORAL_TABLET | Freq: Every evening | ORAL | Status: DC | PRN

## 2012-12-10 MED ORDER — IBUPROFEN 200 MG PO TABS
600.0000 mg | ORAL_TABLET | Freq: Three times a day (TID) | ORAL | Status: DC | PRN
  Administered 2012-12-11: 600 mg via ORAL
  Filled 2012-12-10: qty 3

## 2012-12-10 MED ORDER — ONDANSETRON 4 MG PO TBDP
4.0000 mg | ORAL_TABLET | Freq: Once | ORAL | Status: DC
  Filled 2012-12-10: qty 1

## 2012-12-10 MED ORDER — LORAZEPAM 1 MG PO TABS: 0.0000 mg | ORAL_TABLET | Freq: Two times a day (BID) | ORAL | Status: DC

## 2012-12-10 MED ORDER — AEROCHAMBER PLUS W/MASK MISC
1.0000 | Freq: Once | Status: DC
  Filled 2012-12-10: qty 1

## 2012-12-10 MED ORDER — NICOTINE 21 MG/24HR TD PT24
21.0000 mg | MEDICATED_PATCH | Freq: Every day | TRANSDERMAL | Status: DC
  Administered 2012-12-10: 21 mg via TRANSDERMAL
  Filled 2012-12-10: qty 1

## 2012-12-10 MED ORDER — FOLIC ACID 1 MG PO TABS
1.0000 mg | ORAL_TABLET | Freq: Every day | ORAL | Status: DC
  Administered 2012-12-10 – 2012-12-11 (×2): 1 mg via ORAL
  Filled 2012-12-10 (×2): qty 1

## 2012-12-10 MED ORDER — THIAMINE HCL 100 MG/ML IJ SOLN: 100.0000 mg | Freq: Every day | INTRAMUSCULAR | Status: DC

## 2012-12-10 MED ORDER — LORAZEPAM 1 MG PO TABS: 1.0000 mg | ORAL_TABLET | Freq: Three times a day (TID) | ORAL | Status: DC | PRN

## 2012-12-10 MED ORDER — LORAZEPAM 1 MG PO TABS
0.0000 mg | ORAL_TABLET | Freq: Four times a day (QID) | ORAL | Status: DC
  Administered 2012-12-10: 2 mg via ORAL
  Administered 2012-12-11: 1 mg via ORAL
  Filled 2012-12-10: qty 1
  Filled 2012-12-10: qty 2

## 2012-12-10 MED ORDER — ADULT MULTIVITAMIN W/MINERALS CH
1.0000 | ORAL_TABLET | Freq: Every day | ORAL | Status: DC
  Administered 2012-12-10 – 2012-12-11 (×3): 1 via ORAL
  Filled 2012-12-10 (×2): qty 1

## 2012-12-10 MED ORDER — ONDANSETRON HCL 4 MG PO TABS
4.0000 mg | ORAL_TABLET | Freq: Three times a day (TID) | ORAL | Status: DC | PRN
  Administered 2012-12-11: 4 mg via ORAL
  Filled 2012-12-10: qty 1

## 2012-12-10 NOTE — ED Notes (Addendum)
Pt sent from [facility] by [employer]. Pt went for alcohol dependency, jittery from withdrawal.

## 2012-12-10 NOTE — ED Notes (Signed)
Pt. Made aware of the need of urine. 

## 2012-12-10 NOTE — ED Notes (Signed)
Patient arrived to unit; no s/s of distress noted at this time. Pt pleasant and cooperative with assessment. Pt denies SI/HI or hallucinations currently. Pt verbally contracts for safety.

## 2012-12-10 NOTE — ED Notes (Signed)
Pt. And belongings searched and wanded by security. Pt. Has shirt, pants, socks, shoes, yellow strap bag. Pt. Has 1 belongings bag.

## 2012-12-10 NOTE — [facility] Counselor (Signed)
Tele psych scheduled for 1135.  Dahlia Client PA is in agreement.  If [facility] gets to it first then pt to be assessed quicker.

## 2012-12-10 NOTE — [facility] Counselor (Signed)
Correction, Tele Psych scheduled for 1235 a due to shift change at 11

## 2012-12-10 NOTE — ED Provider Notes (Signed)
CSN: [id number]     Arrival date & time 12/10/12  1848 History  This chart was scribed for non-physician practitioner Dierdre Forth, PA-C working with Roney Marion, MD by Danella Maiers, ED Scribe. This patient was seen in room [facility] and the patient's care was started at 8:43 PM.    Chief Complaint  Patient presents with  . Medical Clearance   The history is provided by the patient. No language interpreter was used.   HPI Comments: Patrick Estrada is a 52 y.o. male who presents to the Emergency Department wanting detox from alcohol. He is currently having withdrawal symptoms such as shaking, emesis, diarrhea, and feeling nervous and cold. He claims he drinks beer 7-8 (40 oz) every day and has been drinking all of his life. He also uses cocaine occasionally and his last use was a few weeks ago. His last drink was 3 days ago. He claims he wanted to stop because he started having HIs about his nextdoor neighbor who has been bothering him lately. He has been in a rehab program [facility]) a year or two ago. He has no history of seizures during withdrawals. He has no other medical problems. He is not on any medications currently. He denies SIs, hallucinations, headaches, CP, cough. He is a current smoker.   Past Medical History  Diagnosis Date  . Arthritis    History reviewed. No pertinent past surgical history. No family history on file. History  Substance Use Topics  . Smoking status: Current Some Day Smoker  . Smokeless tobacco: Not on file  . Alcohol Use: Yes    Review of Systems  Constitutional: Negative for fever, diaphoresis, appetite change, fatigue and unexpected weight change.  HENT: Negative for mouth sores and neck stiffness.   Eyes: Negative for visual disturbance.  Respiratory: Negative for cough, chest tightness, shortness of breath and wheezing.   Cardiovascular: Negative for chest pain.  Gastrointestinal: Positive for vomiting and diarrhea. Negative for nausea,  abdominal pain and constipation.  Endocrine: Negative for polydipsia, polyphagia and polyuria.  Genitourinary: Negative for dysuria, urgency, frequency and hematuria.  Musculoskeletal: Negative for back pain.  Skin: Negative for rash.  Allergic/Immunologic: Negative for immunocompromised state.  Neurological: Negative for syncope, light-headedness and headaches.  Hematological: Does not bruise/bleed easily.  Psychiatric/Behavioral: Negative for suicidal ideas, hallucinations and sleep disturbance. The patient is nervous/anxious.   All other systems reviewed and are negative.    Allergies  Review of patient's allergies indicates no known allergies.  Home Medications  No current outpatient prescriptions on file. BP 148/96  Pulse 71  Temp(Src) 98.5 F (36.9 C) (Oral)  Resp 20  SpO2 100% Physical Exam  Nursing note and vitals reviewed. Constitutional: He is oriented to person, place, and time. He appears well-developed and well-nourished. No distress.  Awake, alert, nontoxic appearance  HENT:  Head: Normocephalic and atraumatic.  Right Ear: Hearing, tympanic membrane, external ear and ear canal normal.  Left Ear: Hearing, tympanic membrane, external ear and ear canal normal.  Nose: Nose normal. No mucosal edema.  Mouth/Throat: Uvula is midline, oropharynx is clear and moist and mucous membranes are normal. Mucous membranes are not dry. No edematous. No oropharyngeal exudate, posterior oropharyngeal edema, posterior oropharyngeal erythema or tonsillar abscesses.  Eyes: Conjunctivae are normal. Pupils are equal, round, and reactive to light. No scleral icterus.  Neck: Normal range of motion. Neck supple.  Cardiovascular: Normal rate, regular rhythm, normal heart sounds and intact distal pulses.   No murmur heard. Pulmonary/Chest: Effort  normal. No accessory muscle usage. Not tachypneic. No respiratory distress. He has no wheezes. He has rhonchi. He has no rales.  Rhonchi throughout   Abdominal: Soft. Bowel sounds are normal. He exhibits no distension. There is no tenderness. There is no rebound.  Musculoskeletal: Normal range of motion. He exhibits no edema.  Lymphadenopathy:    He has no cervical adenopathy.  Neurological: He is alert and oriented to person, place, and time. He exhibits normal muscle tone. Coordination normal.  Speech is clear and goal oriented Moves extremities without ataxia - no tremor noted on exam Pt ambulates without difficulty  Skin: Skin is warm and dry. He is not diaphoretic.  Psychiatric: He has a normal mood and affect.    ED Course  Procedures (including critical care time) Medications  ondansetron (ZOFRAN-ODT) disintegrating tablet 4 mg (not administered)  thiamine (VITAMIN B-1) tablet 100 mg (100 mg Oral Given 12/10/12 2304)    Or  thiamine (B-1) injection 100 mg ( Intravenous See Alternative 12/10/12 2304)  folic acid (FOLVITE) tablet 1 mg (1 mg Oral Given 12/10/12 2305)  multivitamin with minerals tablet 1 tablet (1 tablet Oral Given 12/10/12 2304)  LORazepam (ATIVAN) tablet 0-4 mg (2 mg Oral Given 12/10/12 2303)    Followed by  LORazepam (ATIVAN) tablet 0-4 mg (not administered)  albuterol (PROVENTIL HFA;VENTOLIN HFA) 108 (90 BASE) MCG/ACT inhaler 2 puff (not administered)  aerochamber plus with mask device 1 each (1 each Other Not Given 12/10/12 2308)  LORazepam (ATIVAN) tablet 1 mg (not administered)  ibuprofen (ADVIL,MOTRIN) tablet 600 mg (not administered)  zolpidem (AMBIEN) tablet 5 mg (not administered)  nicotine (NICODERM CQ - dosed in mg/24 hours) patch 21 mg (21 mg Transdermal Patch Applied 12/10/12 2305)  ondansetron (ZOFRAN) tablet 4 mg (not administered)  alum & mag hydroxide-simeth (MAALOX/MYLANTA) 200-200-20 MG/5ML suspension 30 mL (not administered)    DIAGNOSTIC STUDIES: Oxygen Saturation is 100% on room air, normal by my interpretation.    COORDINATION OF CARE: 9:07 PM- Discussed treatment plan with pt and pt  agrees to plan.    Labs Review Labs Reviewed  COMPREHENSIVE METABOLIC PANEL - Abnormal; Notable for the following:    AST 86 (*)    ALT 56 (*)    All other components within normal limits  ETHANOL - Abnormal; Notable for the following:    Alcohol, Ethyl (B) 66 (*)    All other components within normal limits  SALICYLATE LEVEL - Abnormal; Notable for the following:    Salicylate Lvl <2.0 (*)    All other components within normal limits  URINE RAPID DRUG SCREEN (HOSP PERFORMED) - Abnormal; Notable for the following:    Tetrahydrocannabinol POSITIVE (*)    All other components within normal limits  ACETAMINOPHEN LEVEL  CBC   Imaging Review No results found.  MDM   1. ETOH abuse   2. Cocaine abuse     BAILEY KOLBE presents for EtOH detox.  EtOH level < 66.  Pt is clinically sober and does have some withdrawal symptoms.    Patient has been medically cleared in the ED and is awaiting consult by ACT team for possible placement vs OP clinic information. Pt is currently not having SI or HI and appears stable in NAD. Pt is cleared to be moved back to [facility].   I personally performed the services described in this documentation, which was scribed in my presence. The recorded information has been reviewed and is accurate.   Dahlia Client Shakirah Kirkey, PA-C 12/11/12 [phone]

## 2012-12-11 ENCOUNTER — Encounter (HOSPITAL_COMMUNITY): Payer: Self-pay | Admitting: *Deleted

## 2012-12-11 ENCOUNTER — Inpatient Hospital Stay (HOSPITAL_COMMUNITY)
Admission: AD | Admit: 2012-12-11 | Discharge: 2012-12-14 | DRG: 897 | Disposition: A | Discharge status: Home / Self Care | Payer: Self-pay | Source: Intra-hospital | Attending: Psychiatry | Admitting: Psychiatry

## 2012-12-11 DIAGNOSIS — F411 Generalized anxiety disorder: Secondary | ICD-10-CM | POA: Diagnosis present

## 2012-12-11 DIAGNOSIS — F17200 Nicotine dependence, unspecified, uncomplicated: Secondary | ICD-10-CM | POA: Diagnosis present

## 2012-12-11 DIAGNOSIS — F1020 Alcohol dependence, uncomplicated: Secondary | ICD-10-CM | POA: Diagnosis present

## 2012-12-11 DIAGNOSIS — F321 Major depressive disorder, single episode, moderate: Secondary | ICD-10-CM | POA: Diagnosis present

## 2012-12-11 DIAGNOSIS — F1010 Alcohol abuse, uncomplicated: Secondary | ICD-10-CM

## 2012-12-11 DIAGNOSIS — J45909 Unspecified asthma, uncomplicated: Secondary | ICD-10-CM | POA: Diagnosis present

## 2012-12-11 DIAGNOSIS — F14120 Cocaine abuse with intoxication, uncomplicated: Secondary | ICD-10-CM

## 2012-12-11 DIAGNOSIS — R45850 Homicidal ideations: Secondary | ICD-10-CM

## 2012-12-11 DIAGNOSIS — F1994 Other psychoactive substance use, unspecified with psychoactive substance-induced mood disorder: Secondary | ICD-10-CM | POA: Diagnosis present

## 2012-12-11 DIAGNOSIS — F10239 Alcohol dependence with withdrawal, unspecified: Principal | ICD-10-CM | POA: Diagnosis present

## 2012-12-11 DIAGNOSIS — M129 Arthropathy, unspecified: Secondary | ICD-10-CM | POA: Diagnosis present

## 2012-12-11 DIAGNOSIS — F10939 Alcohol use, unspecified with withdrawal, unspecified: Principal | ICD-10-CM | POA: Diagnosis present

## 2012-12-11 HISTORY — DX: Unspecified asthma, uncomplicated: J45.909

## 2012-12-11 MED ORDER — FOLIC ACID 1 MG PO TABS
1.0000 mg | ORAL_TABLET | Freq: Every day | ORAL | Status: DC
  Administered 2012-12-12 – 2012-12-14 (×3): 1 mg via ORAL
  Filled 2012-12-11 (×4): qty 1

## 2012-12-11 MED ORDER — ONDANSETRON HCL 4 MG PO TABS: 4.0000 mg | ORAL_TABLET | Freq: Three times a day (TID) | ORAL | Status: DC | PRN

## 2012-12-11 MED ORDER — THIAMINE HCL 100 MG/ML IJ SOLN: 100.0000 mg | Freq: Every day | INTRAMUSCULAR | Status: DC

## 2012-12-11 MED ORDER — ACETAMINOPHEN 325 MG PO TABS: 650.0000 mg | ORAL_TABLET | Freq: Four times a day (QID) | ORAL | Status: DC | PRN

## 2012-12-11 MED ORDER — LORAZEPAM 1 MG PO TABS: 1.0000 mg | ORAL_TABLET | Freq: Three times a day (TID) | ORAL | Status: DC | PRN

## 2012-12-11 MED ORDER — ADULT MULTIVITAMIN W/MINERALS CH
1.0000 | ORAL_TABLET | Freq: Every day | ORAL | Status: DC
  Administered 2012-12-12 – 2012-12-14 (×3): 1 via ORAL
  Filled 2012-12-11 (×4): qty 1

## 2012-12-11 MED ORDER — LORAZEPAM 1 MG PO TABS
0.0000 mg | ORAL_TABLET | Freq: Two times a day (BID) | ORAL | Status: DC
  Administered 2012-12-13 – 2012-12-14 (×2): 1 mg via ORAL
  Filled 2012-12-11: qty 1

## 2012-12-11 MED ORDER — LORAZEPAM 1 MG PO TABS
0.0000 mg | ORAL_TABLET | Freq: Four times a day (QID) | ORAL | Status: AC
  Administered 2012-12-12 (×2): 1 mg via ORAL
  Administered 2012-12-12: 2 mg via ORAL
  Administered 2012-12-13 (×2): 1 mg via ORAL
  Filled 2012-12-11: qty 2
  Filled 2012-12-11 (×5): qty 1
  Filled 2012-12-11: qty 2

## 2012-12-11 MED ORDER — ALUM & MAG HYDROXIDE-SIMETH 200-200-20 MG/5ML PO SUSP: 30.0000 mL | ORAL | Status: DC | PRN

## 2012-12-11 MED ORDER — VITAMIN B-1 100 MG PO TABS
100.0000 mg | ORAL_TABLET | Freq: Every day | ORAL | Status: DC
  Administered 2012-12-12 – 2012-12-14 (×3): 100 mg via ORAL
  Filled 2012-12-11 (×4): qty 1

## 2012-12-11 MED ORDER — NICOTINE 21 MG/24HR TD PT24
21.0000 mg | MEDICATED_PATCH | Freq: Every day | TRANSDERMAL | Status: DC
  Administered 2012-12-12 – 2012-12-14 (×3): 21 mg via TRANSDERMAL
  Filled 2012-12-11 (×5): qty 1

## 2012-12-11 MED ORDER — IBUPROFEN 600 MG PO TABS
600.0000 mg | ORAL_TABLET | Freq: Three times a day (TID) | ORAL | Status: DC | PRN
  Administered 2012-12-13: 600 mg via ORAL
  Filled 2012-12-11: qty 1

## 2012-12-11 MED ORDER — ALBUTEROL SULFATE HFA 108 (90 BASE) MCG/ACT IN AERS: 2.0000 | INHALATION_SPRAY | RESPIRATORY_TRACT | Status: DC | PRN

## 2012-12-11 MED ORDER — ZOLPIDEM TARTRATE 5 MG PO TABS
5.0000 mg | ORAL_TABLET | Freq: Every evening | ORAL | Status: DC | PRN
  Administered 2012-12-12 – 2012-12-13 (×2): 5 mg via ORAL
  Filled 2012-12-11 (×3): qty 1

## 2012-12-11 MED ORDER — MAGNESIUM HYDROXIDE 400 MG/5ML PO SUSP: 30.0000 mL | Freq: Every day | ORAL | Status: DC | PRN

## 2012-12-11 NOTE — Progress Notes (Signed)
Pt accepted to [facility] by Dr. Ladona Ridgel pending 300 hall bed.   Catha Gosselin, LCSW [phone]  ED CSW .12/11/2012 1031am

## 2012-12-11 NOTE — Progress Notes (Signed)
Pt accepted to [facility] 301-2, Josephine to Dr. Dub Mikes. Patient to be transported voluntarily by security. Psychiatric np and rn aware.   Catha Gosselin, LCSW [phone]  ED CSW .12/11/2012 1317pm

## 2012-12-11 NOTE — [facility] Counselor (Signed)
Writer contacted the Psych Department to attempted to assess the patient again.  However, writer was informed by the staff that the patient was not alert or able to participate in the assessment due to being given ativan earlier.   Writer informed the nurse to contact the assessment office once the patient is alert and able to participate in the assessment.

## 2012-12-11 NOTE — [facility] Counselor (Signed)
Writer spoke to the nurse working with the patient. Writer was informed that the patient was given medication due to his shaking due to his withdrawal symptoms.  Writer was not able to assess the patient.  Writer was informed that the nurse will call the [facility] office and schedule another appointment time for the patient when he is awake and able to be assessed.

## 2012-12-11 NOTE — ED Notes (Signed)
Pt is awake and alert, pleasant and cooperative. Patient denies HI, SI AH or VH. Discharge vitals 136/81 HR 88 RR 16 and unlabored. Pt has inpatient treatment scheduled. Will continue to monitor for safety. [facility]. T.Melvyn Neth RN

## 2012-12-11 NOTE — Progress Notes (Addendum)
Patient ID: Patrick Estrada, male   DOB: 09/01/1960, 52 y.o.   MRN: [id number] Pt is pleasant and cooperative. Pt states he is here because "I wants help because I am  going crazy. Therefore, I begin to drink to not feel that way. Pt denies SI/HI/AV and pain. Pt is currently on no home medications. Pt is also a heavy drinker and drinks about 8 to 9 40oz beers a day. Pt states he also uses THC and cocaine.

## 2012-12-11 NOTE — Consult Note (Signed)
[facility] Face-to-Face Psychiatry Consult   Reason for Consult:  ALCOHOL DEPENDENCE, Homicide Towards his neighbor Referring Physician:  [name] is an 52 y.o. male.  Assessment: AXIS I:  Alcohol Abuse AXIS II:  Deferred AXIS III:   Past Medical History  Diagnosis Date  . Arthritis    AXIS IV:  other psychosocial or environmental problems and problems related to social environment AXIS V:  41-50 serious symptoms  Plan:  Recommend psychiatric Inpatient admission when medically cleared.  Subjective:   Patrick Estrada is a 52 y.o. male patient admitted with Alcohol Dependence and Homicidal ideation towards neighbor.  HPI:  Patient was brought in by the police from [place] after drinking and getting intoxicated.  Patient also was /and is still endorsing homicidal thoughts towards his neighbor.  He did not say why he want to kill his neighbor but stated this am " If I see him I will kill him for messing up with my sister"  Patient drinks 7-8 40 oz beer daily or "any amount I can hold on to" and started drinking at the age of 15 years off and on.  Patient reports he was in a rehabilitation treatment 2012 and was sober for 4 months before relapsing.  Patient reports he uses Marijuana once a week but denies using any other illicit drug.  He denies withdrawal seizure but states he drinks until he blacks out.   During this interview patient c/o nausea and vomiting and was medicated with Zofran.  We will admit him and use our Ativan protocol for his detox. HPI Elements:   Location:  [facility]. Quality:  severe to the extent of having homicidal thoughts towards his neighbor. Severity:  severe. Duration:  started drinking at age 26.  Past Psychiatric History: Past Medical History  Diagnosis Date  . Arthritis     reports that he has been smoking.  He does not have any smokeless tobacco history on file. He reports that  drinks alcohol. His drug history is not on file. No family history on file.          Allergies:  No Known Allergies  ACT Assessment Complete:  Yes:    Educational Status    Risk to Self: Risk to self Is patient at risk for suicide?: No Substance abuse history and/or treatment for substance abuse?: Yes  Risk to Others:    Abuse:    Prior Inpatient Therapy:    Prior Outpatient Therapy:    Additional Information:                    Objective: Blood pressure 141/94, pulse 79, temperature 97.9 F (36.6 C), temperature source Oral, resp. rate 16, SpO2 93.00%.There is no height or weight on file to calculate BMI. Results for orders placed during the hospital encounter of 12/10/12 (from the past 72 hour(s))  ACETAMINOPHEN LEVEL     Status: None   Collection Time    12/10/12  7:35 PM      Result Value Range   Acetaminophen (Tylenol), Serum <15.0  10 - 30 ug/mL   Comment:            THERAPEUTIC CONCENTRATIONS VARY     SIGNIFICANTLY. A RANGE OF 10-30     ug/mL MAY BE AN EFFECTIVE     CONCENTRATION FOR MANY PATIENTS.     HOWEVER, SOME ARE BEST TREATED     AT CONCENTRATIONS OUTSIDE THIS     RANGE.     ACETAMINOPHEN  CONCENTRATIONS     >150 ug/mL AT 4 HOURS AFTER     INGESTION AND >50 ug/mL AT 12     HOURS AFTER INGESTION ARE     OFTEN ASSOCIATED WITH TOXIC     REACTIONS.  CBC     Status: None   Collection Time    12/10/12  7:35 PM      Result Value Range   WBC 5.3  4.0 - 10.5 K/uL   RBC 5.26  4.22 - 5.81 MIL/uL   Hemoglobin 16.9  13.0 - 17.0 g/dL   HCT 16.1  09.6 - 04.5 %   MCV 90.9  78.0 - 100.0 fL   MCH 32.1  26.0 - 34.0 pg   MCHC 35.4  30.0 - 36.0 g/dL   RDW 40.9  81.1 - 91.4 %   Platelets 226  150 - 400 K/uL  COMPREHENSIVE METABOLIC PANEL     Status: Abnormal   Collection Time    12/10/12  7:35 PM      Result Value Range   Sodium 137  135 - 145 mEq/L   Potassium 4.0  3.5 - 5.1 mEq/L   Chloride 102  96 - 112 mEq/L   CO2 22  19 - 32 mEq/L   Glucose, Bld 86  70 - 99 mg/dL   BUN 14  6 - 23 mg/dL   Creatinine, Ser 7.82  0.50 - 1.35  mg/dL   Calcium 9.4  8.4 - 95.6 mg/dL   Total Protein 8.0  6.0 - 8.3 g/dL   Albumin 3.7  3.5 - 5.2 g/dL   AST 86 (*) 0 - 37 U/L   ALT 56 (*) 0 - 53 U/L   Alkaline Phosphatase 47  39 - 117 U/L   Total Bilirubin 0.4  0.3 - 1.2 mg/dL   GFR calc non Af Amer >90  >90 mL/min   GFR calc Af Amer >90  >90 mL/min   Comment: (NOTE)     The eGFR has been calculated using the CKD EPI equation.     This calculation has not been validated in all clinical situations.     eGFR's persistently <90 mL/min signify possible Chronic Kidney     Disease.  ETHANOL     Status: Abnormal   Collection Time    12/10/12  7:35 PM      Result Value Range   Alcohol, Ethyl (B) 66 (*) 0 - 11 mg/dL   Comment:            LOWEST DETECTABLE LIMIT FOR     SERUM ALCOHOL IS 11 mg/dL     FOR MEDICAL PURPOSES ONLY  SALICYLATE LEVEL     Status: Abnormal   Collection Time    12/10/12  7:35 PM      Result Value Range   Salicylate Lvl <2.0 (*) 2.8 - 20.0 mg/dL  URINE RAPID DRUG SCREEN (HOSP PERFORMED)     Status: Abnormal   Collection Time    12/10/12  8:24 PM      Result Value Range   Opiates NONE DETECTED  NONE DETECTED   Cocaine NONE DETECTED  NONE DETECTED   Benzodiazepines NONE DETECTED  NONE DETECTED   Amphetamines NONE DETECTED  NONE DETECTED   Tetrahydrocannabinol POSITIVE (*) NONE DETECTED   Barbiturates NONE DETECTED  NONE DETECTED   Comment:            DRUG SCREEN FOR MEDICAL PURPOSES     ONLY.  IF CONFIRMATION IS NEEDED  FOR ANY PURPOSE, NOTIFY LAB     WITHIN 5 DAYS.                LOWEST DETECTABLE LIMITS     FOR URINE DRUG SCREEN     Drug Class       Cutoff (ng/mL)     Amphetamine      1000     Barbiturate      200     Benzodiazepine   200     Tricyclics       300     Opiates          300     Cocaine          300     THC              50   Labs are reviewed and are pertinent for Elevated LFT, Alcohol 66 and UDS is positive for Marijuana..  Current Facility-Administered Medications   Medication Dose Route Frequency Provider Last Rate Last Dose  . aerochamber plus with mask device 1 each  1 each Other Once Hannah Muthersbaugh, PA-C      . albuterol (PROVENTIL HFA;VENTOLIN HFA) 108 (90 BASE) MCG/ACT inhaler 2 puff  2 puff Inhalation Q4H PRN Hannah Muthersbaugh, PA-C      . alum & mag hydroxide-simeth (MAALOX/MYLANTA) 200-200-20 MG/5ML suspension 30 mL  30 mL Oral PRN Hannah Muthersbaugh, PA-C      . folic acid (FOLVITE) tablet 1 mg  1 mg Oral Daily Hannah Muthersbaugh, PA-C   1 mg at 12/11/12 0981  . ibuprofen (ADVIL,MOTRIN) tablet 600 mg  600 mg Oral Q8H PRN Hannah Muthersbaugh, PA-C   600 mg at 12/11/12 1154  . LORazepam (ATIVAN) tablet 0-4 mg  0-4 mg Oral Q6H Hannah Muthersbaugh, PA-C   1 mg at 12/11/12 1914   Followed by  . [START ON 12/12/2012] LORazepam (ATIVAN) tablet 0-4 mg  0-4 mg Oral Q12H Hannah Muthersbaugh, PA-C      . LORazepam (ATIVAN) tablet 1 mg  1 mg Oral Q8H PRN Hannah Muthersbaugh, PA-C      . multivitamin with minerals tablet 1 tablet  1 tablet Oral Daily Hannah Muthersbaugh, PA-C   1 tablet at 12/11/12 0939  . nicotine (NICODERM CQ - dosed in mg/24 hours) patch 21 mg  21 mg Transdermal Daily Hannah Muthersbaugh, PA-C   21 mg at 12/10/12 2305  . ondansetron (ZOFRAN) tablet 4 mg  4 mg Oral Q8H PRN Hannah Muthersbaugh, PA-C   4 mg at 12/11/12 0918  . ondansetron (ZOFRAN-ODT) disintegrating tablet 4 mg  4 mg Oral Once Renne Crigler, PA-C      . thiamine (VITAMIN B-1) tablet 100 mg  100 mg Oral Daily Hannah Muthersbaugh, PA-C   100 mg at 12/11/12 7829   Or  . thiamine (B-1) injection 100 mg  100 mg Intravenous Daily Hannah Muthersbaugh, PA-C      . zolpidem (AMBIEN) tablet 5 mg  5 mg Oral QHS PRN Dahlia Client Muthersbaugh, PA-C       No current outpatient prescriptions on file.    Psychiatric Specialty Exam:     Blood pressure 141/94, pulse 79, temperature 97.9 F (36.6 C), temperature source Oral, resp. rate 16, SpO2 93.00%.There is no height or weight on  file to calculate BMI.  General Appearance: Casual  Eye Contact::  Good  Speech:  Clear and Coherent  Volume:  Decreased  Mood:  Angry, Anxious and Depressed  Affect:  Congruent, Depressed and  Flat  Thought Process:  Coherent and Intact  Orientation:  Full (Time, Place, and Person)  Thought Content:  NA  Suicidal Thoughts:  No  Homicidal Thoughts:  Yes.  without intent/plan  Memory:  Immediate;   Fair Recent;   Fair Remote;   Fair  Judgement:  Fair,poor  Insight:  Shallow  Psychomotor Activity:  Normal  Concentration:  Poor  Recall:  NA  Akathisia:  NA  Handed:  Right  AIMS (if indicated):     Assets:  Desire for Improvement  Sleep:      Treatment Plan Summary:  Face to face interview and consult with Dr Ladona Ridgel We will admit patient to our inpatient Chemical dependency unit for detox from alcohol We will use our Ativan protocol since he has elevated LFT We will address his homicidal ideation when he is sober and will encourage individual and group therapy.  Daily contact with patient to assess and evaluate symptoms and progress in treatment Medication management  Earney Navy   PMHNP-BC 12/11/2012 1:37 PM

## 2012-12-11 NOTE — Tx Team (Signed)
Initial Interdisciplinary Treatment Plan  PATIENT STRENGTHS: (choose at least two) Ability for insight Average or above average intelligence Capable of independent living Communication skills Supportive family/friends  PATIENT STRESSORS: Substance abuse Pressure from different things   PROBLEM LIST: Problem List/Patient Goals Date to be addressed Date deferred Reason deferred Estimated date of resolution  Substance Abuse 12/11/2012                                                      DISCHARGE CRITERIA:  Ability to meet basic life and health needs Improved stabilization in mood, thinking, and/or behavior Withdrawal symptoms are absent or subacute and managed without 24-hour nursing intervention  PRELIMINARY DISCHARGE PLAN: Attend aftercare/continuing care group Attend 12-step recovery group Return to previous living arrangement  PATIENT/FAMIILY INVOLVEMENT: This treatment plan has been presented to and reviewed with the patient, Patrick Estrada, and/or family member.  The patient and family have been given the opportunity to ask questions and make suggestions.  Omelia Blackwater Violon 12/11/2012, 4:56 PM

## 2012-12-11 NOTE — [facility] Group Notes (Signed)
Adult Psychoeducational Group Note  Date:  12/11/2012 Time:  10:28 PM  Group Topic/Focus:  AA  Participation Level:  Did Not Attend  Participation Quality:  None  Affect:  None  Cognitive:  None  Insight: None  Engagement in Group:  None  Modes of Intervention:  Discussion  Additional Comments:  Orva did not attend.  Caroll Rancher A 12/11/2012, 10:28 PM

## 2012-12-12 ENCOUNTER — Encounter (HOSPITAL_COMMUNITY): Payer: Self-pay | Admitting: Psychiatry

## 2012-12-12 DIAGNOSIS — F1010 Alcohol abuse, uncomplicated: Secondary | ICD-10-CM

## 2012-12-12 DIAGNOSIS — F10939 Alcohol use, unspecified with withdrawal, unspecified: Principal | ICD-10-CM

## 2012-12-12 DIAGNOSIS — F411 Generalized anxiety disorder: Secondary | ICD-10-CM

## 2012-12-12 DIAGNOSIS — F1020 Alcohol dependence, uncomplicated: Secondary | ICD-10-CM | POA: Diagnosis present

## 2012-12-12 DIAGNOSIS — F1994 Other psychoactive substance use, unspecified with psychoactive substance-induced mood disorder: Secondary | ICD-10-CM

## 2012-12-12 DIAGNOSIS — F10239 Alcohol dependence with withdrawal, unspecified: Principal | ICD-10-CM

## 2012-12-12 DIAGNOSIS — F1910 Other psychoactive substance abuse, uncomplicated: Secondary | ICD-10-CM

## 2012-12-12 MED ORDER — NABUMETONE 500 MG PO TABS
500.0000 mg | ORAL_TABLET | Freq: Two times a day (BID) | ORAL | Status: DC
  Administered 2012-12-12 – 2012-12-14 (×4): 500 mg via ORAL
  Filled 2012-12-12 (×7): qty 1

## 2012-12-12 MED ORDER — METHOCARBAMOL 500 MG PO TABS
500.0000 mg | ORAL_TABLET | Freq: Three times a day (TID) | ORAL | Status: DC
  Administered 2012-12-12 – 2012-12-14 (×6): 500 mg via ORAL
  Filled 2012-12-12 (×10): qty 1

## 2012-12-12 NOTE — Progress Notes (Signed)
Patient did not attend the evening speaker AA meeting. Pt remained in bed during meeting.   

## 2012-12-12 NOTE — Progress Notes (Addendum)
Patient ID: Patrick Estrada, male   DOB: [birth year], 52 y.o.   MRN: [id number] CSW unable to wake patient to complete PSA at 910AM and 9:40AM. Will attempt later today. Carney Bern, LCSW
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
## 2012-12-12 NOTE — Progress Notes (Signed)
Patient ID: Patrick Estrada, male   DOB: 09-12-1960, 52 y.o.   MRN: [id number]  D: Pt has been very flat and depressed on the unit. Pt was in the bed most of the day, and got up to take medication. Pt did not go to groups and did not engage in treatment. Pt did report some withdrawal symptoms, he reported that he was anxious. Pt reported being negative SI/HI, no AH/VH noted. A: 15 min checks continued for patient safety. R: Pts safety maintained.

## 2012-12-12 NOTE — Progress Notes (Signed)
D.  Pt sleeping upon approach, new admission, see admission note.  Did not wake Pt up since he arrived near 5 pm this evening.  Respirations even and unlabored.  A.  Will continue to monitor.  R.  Pt remains safe on unit
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
## 2012-12-12 NOTE — [facility] Group Notes (Signed)
[facility] Group Notes: (Clinical Social Work)   12/12/2012      Type of Therapy:  Group Therapy   Participation Level:  Did Not Attend    Jetta Murray Grossman-Orr, LCSW 12/12/2012, 11:58 AM     
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
## 2012-12-12 NOTE — [facility] Group Notes (Signed)
[facility] Group Notes:  (Nursing/MHT/Case Management/Adjunct)  Date:  12/12/2012  Time:  11:56 AM  Type of Therapy:  Psychoeducational Skills  Participation Level:  Did Not Attend   Elanie Hammitt Shanta 12/12/2012, 11:56 AM 

## 2012-12-13 DIAGNOSIS — F10239 Alcohol dependence with withdrawal, unspecified: Principal | ICD-10-CM

## 2012-12-13 DIAGNOSIS — F1020 Alcohol dependence, uncomplicated: Secondary | ICD-10-CM

## 2012-12-13 DIAGNOSIS — F1410 Cocaine abuse, uncomplicated: Secondary | ICD-10-CM

## 2012-12-13 DIAGNOSIS — F10939 Alcohol use, unspecified with withdrawal, unspecified: Principal | ICD-10-CM

## 2012-12-13 NOTE — Progress Notes (Signed)
Patient did not attend the evening speaker AA meeting. Pt was notified that group was beginning but remained in bed.   

## 2012-12-13 NOTE — Progress Notes (Signed)
D.  Pt has been in room in bed most of day, did get up this evening.  Did not attend evening AA group.  Pt did request medication for insomnia stating "Will this make me sleep?"   Denies SI/HI/hallucinations at this time.  Minimal interaction on unit.  A.  Support and encouragement offered  R.  Pt remains safe at this time, will continue to monitor.

## 2012-12-13 NOTE — Progress Notes (Signed)
Psychoeducational Group Note  Date:  12/13/2012 Time:  115 Group Topic/Focus:  Living with healthy attitudes:   The focus of this group is to help patients identify negative/unhealthy attitudes they were using prior to admission and identify positive/healthier attitudes to replace them upon discharge.  Participation Level: Did Not Attend  Participation Quality:  Not Applicable  Affect:  Not Applicable  Cognitive:  Not Applicable  Insight:  Not Applicable  Engagement in Group: Not Applicable  Additional Comments:    Rich Brave 12/13/2012, 3:35 PM

## 2012-12-13 NOTE — Progress Notes (Deleted)
Patient ID: Patrick Estrada, male   DOB: [birth year], 52 y.o.   MRN: [id number]

## 2012-12-13 NOTE — Progress Notes (Signed)
D.  Pt remained in bed most of shift, did get up for evening medications.  Pt did not attend evening AA group.  Minimal interaction on the unit.  Pt states that he is ready for discharge at this time.  Denies SI/HI/hallucinations at this time.  A.  Support and encouragement offered  R.  Pt remains safe on unit, will continue to monitor.

## 2012-12-13 NOTE — ED Provider Notes (Signed)
Medical screening examination/treatment/procedure(s) were performed by non-physician practitioner and as supervising physician I was immediately available for consultation/collaboration.   Roney Marion, MD 12/13/12 [phone]

## 2012-12-13 NOTE — Progress Notes (Signed)
[facility] Group Notes:  (Nursing/MHT/Case Management/Adjunct)  Date:  12/13/2012  Time:  6:29 PM  Type of Therapy:  Psychoeducational Skills  Participation Level:  Did Not Attend   Caswell Corwin 12/13/2012, 6:29 PM

## 2012-12-13 NOTE — [facility] Group Notes (Signed)
[facility] Group Notes:  (Clinical Social Work)  12/13/2012  10:00-11:00AM  Summary of Progress/Problems:   The main focus of today's process group was to   identify the patient's current support system and decide on other supports that can be put in place.  The picture on workbook was used to discuss why additional supports are needed, and a hand-out was distributed with four definitions/levels of support, then used to talk about how patients have given and received all different kinds of support.  An emphasis was placed on using counselor, doctor, therapy groups, 12-step groups, and problem-specific support groups to expand supports.  The patient identified one additional support as being getting a sponsor instead of just attending AA meetings.  Type of Therapy:  Process Group with Motivational Interviewing  Participation Level:  Minimal  Participation Quality:  Attentive  Affect:  Blunted  Cognitive:  Oriented  Insight:  Improving  Engagement in Therapy:  Developing/Improving  Modes of Intervention:   Education, Support and Processing, Activity  Ambrose Mantle, LCSW 12/13/2012, 12:48 PM

## 2012-12-13 NOTE — Progress Notes (Signed)
Patient ID: Patrick Estrada, male   DOB: Aug 19, 1960, 52 y.o.   MRN: [id number]  D: Pt has been very flat and depressed on the unit, he has been very isolative and very guarded. Pt did not attend any groups and has not engaged in treatment. Pt did request today to be discharged, because he reported that the medication was working. Pt reported being negative SI/HI, no AH/VH noted. A: 15 min checks continued for patient safety. R: Pts safety maintained.

## 2012-12-13 NOTE — [facility] Group Notes (Signed)
[facility] Group Notes:  (Nursing/MHT/Case Management/Adjunct)  Date:  12/13/2012  Time:  3:02 PM  Type of Therapy:  Psychoeducational Skills  Participation Level:  Did Not Attend  Buford Dresser 12/13/2012, 3:02 PM

## 2012-12-13 NOTE — Progress Notes (Signed)
[facility] MD Progress Note  12/13/2012 3:55 PM Patrick Estrada  MRN:  [id number]  Subjective: Patrick Estrada reports, "I came here for alcohol detox. I feel fine now. I'm not depressed. I'm ready to go home".  Diagnosis:   DSM5: Schizophrenia Disorders:  NA Obsessive-Compulsive Disorders:  NA Trauma-Stressor Disorders:  NA Substance/Addictive Disorders:  Alcohol dependence Depressive Disorders:  NA  Axis I: Alcohol dependence Axis II: Deferred Axis III:  Past Medical History  Diagnosis Date  . Arthritis   . Asthma    Axis IV: other psychosocial or environmental problems and Alcoholism Axis V: 51-60 moderate symptoms  ADL's:  Impaired  Sleep: Good  Appetite:  Good  Suicidal Ideation:  Plan:  Denies Intent:  Denies Means:  Denies  Homicidal Ideation:  Plan:  Denies Intent:  Denies Means:  Denies  AEB (as evidenced by):  Psychiatric Specialty Exam: Review of Systems  Constitutional: Negative.   HENT: Negative.   Eyes: Negative.   Respiratory: Negative.   Cardiovascular: Negative.   Gastrointestinal: Negative.   Genitourinary: Negative.   Musculoskeletal: Negative.   Skin: Negative.   Neurological: Negative.   Endo/Heme/Allergies: Negative.   Psychiatric/Behavioral: Positive for substance abuse (Alcoholism). Negative for depression, suicidal ideas, hallucinations and memory loss. The patient is not nervous/anxious and does not have insomnia.     Blood pressure 136/91, pulse 86, temperature 97.5 F (36.4 C), temperature source Oral, resp. rate 19, height 5' 9.69" (1.77 m), weight 74.39 kg (164 lb).Body mass index is 23.74 kg/(m^2).  General Appearance: Disheveled  Eye Contact::  Good  Speech:  Clear and Coherent  Volume:  Normal  Mood:  Denies feeling depressed and or anxious  Affect:  Restricted  Thought Process:  Coherent and Intact  Orientation:  Full (Time, Place, and Person)  Thought Content:  Denies any hallucinations, delusions and or paranoia  Suicidal Thoughts:   No  Homicidal Thoughts:  No  Memory:  Immediate;   Good Recent;   Good Remote;   Good  Judgement:  Fair  Insight:  Fair  Psychomotor Activity:  Normal  Concentration:  Good  Recall:  Good  Akathisia:  No  Handed:  Right  AIMS (if indicated):     Assets:  Desire for Improvement  Sleep:  Number of Hours: 5.25   Current Medications: Current Facility-Administered Medications  Medication Dose Route Frequency Provider Last Rate Last Dose  . acetaminophen (TYLENOL) tablet 650 mg  650 mg Oral Q6H PRN Earney Navy, NP      . albuterol (PROVENTIL HFA;VENTOLIN HFA) 108 (90 BASE) MCG/ACT inhaler 2 puff  2 puff Inhalation Q4H PRN Earney Navy, NP      . alum & mag hydroxide-simeth (MAALOX/MYLANTA) 200-200-20 MG/5ML suspension 30 mL  30 mL Oral Q4H PRN Earney Navy, NP      . folic acid (FOLVITE) tablet 1 mg  1 mg Oral Daily Earney Navy, NP   1 mg at 12/13/12 9147  . ibuprofen (ADVIL,MOTRIN) tablet 600 mg  600 mg Oral Q8H PRN Earney Navy, NP      . LORazepam (ATIVAN) tablet 0-4 mg  0-4 mg Oral Q6H Earney Navy, NP   1 mg at 12/13/12 8295   Followed by  . LORazepam (ATIVAN) tablet 0-4 mg  0-4 mg Oral Q12H Josephine C Onuoha, NP      . LORazepam (ATIVAN) tablet 1 mg  1 mg Oral Q8H PRN Earney Navy, NP      . magnesium hydroxide (MILK  OF MAGNESIA) suspension 30 mL  30 mL Oral Daily PRN Earney Navy, NP      . methocarbamol (ROBAXIN) tablet 500 mg  500 mg Oral TID Nanine Means, NP   500 mg at 12/13/12 1203  . multivitamin with minerals tablet 1 tablet  1 tablet Oral Daily Earney Navy, NP   1 tablet at 12/13/12 7829  . nabumetone (RELAFEN) tablet 500 mg  500 mg Oral BID Nanine Means, NP   500 mg at 12/13/12 5621  . nicotine (NICODERM CQ - dosed in mg/24 hours) patch 21 mg  21 mg Transdermal Daily Earney Navy, NP   21 mg at 12/13/12 3086  . ondansetron (ZOFRAN) tablet 4 mg  4 mg Oral Q8H PRN Earney Navy, NP      . thiamine (VITAMIN  B-1) tablet 100 mg  100 mg Oral Daily Earney Navy, NP   100 mg at 12/13/12 5784   Or  . thiamine (B-1) injection 100 mg  100 mg Intravenous Daily Earney Navy, NP      . zolpidem (AMBIEN) tablet 5 mg  5 mg Oral QHS PRN Earney Navy, NP   5 mg at 12/12/12 2131    Lab Results: No results found for this or any previous visit (from the past 48 hour(s)).  Physical Findings: AIMS: Facial and Oral Movements Muscles of Facial Expression: None, normal Lips and Perioral Area: None, normal Jaw: None, normal Tongue: None, normal,Extremity Movements Upper (arms, wrists, hands, fingers): None, normal Lower (legs, knees, ankles, toes): None, normal, Trunk Movements Neck, shoulders, hips: None, normal, Overall Severity Severity of abnormal movements (highest score from questions above): Mild Incapacitation due to abnormal movements: Minimal Patient's awareness of abnormal movements (rate only patient's report): Aware, no distress, Dental Status Current problems with teeth and/or dentures?: Yes Does patient usually wear dentures?: Yes  CIWA:  CIWA-Ar Total: 2 COWS:     Treatment Plan Summary: Daily contact with patient to assess and evaluate symptoms and progress in treatment Medication management  Plan: Supportive approach/coping skills/relapse prevention. Encouraged out of room, participation in group sessions and application of coping skills when distressed. Will continue to monitor response to/adverse effects of medications in use to assure effectiveness. Continue to monitor mood, behavior and interaction with staff and other patients. Continue current plan of care.  Medical Decision Making Problem Points:  Established problem, stable/improving (1), Review of last therapy session (1) and Review of psycho-social stressors (1) Data Points:  Review of medication regiment & side effects (2) Review of new medications or change in dosage (2)  I certify that inpatient services  furnished can reasonably be expected to improve the patient's condition.   Armandina Stammer I, PMHNP-BC 12/13/2012, 3:55 PM

## 2012-12-14 MED ORDER — ALBUTEROL SULFATE HFA 108 (90 BASE) MCG/ACT IN AERS: 2.0000 | INHALATION_SPRAY | RESPIRATORY_TRACT | Status: DC | PRN

## 2012-12-14 NOTE — Progress Notes (Signed)
[facility] Adult Case Management Discharge Plan :  Will you be returning to the same living situation after discharge: Yes,  home At discharge, do you have transportation home?:Yes,  friend Do you have the ability to pay for your medications:Yes,  mental health  Release of information consent forms completed and in the chart;  Patient's signature needed at discharge.  Patient to Follow up at: Follow-up Information   Follow up with [facility]. (Walk in between 8AM-9AM Monday through Friday for hospital follow-up/medication management. )    Contact information:   [address], [place] [address] Phone: [phone] Fax: [phone]      Patient denies SI/HI:   Yes,  during admission, group, and self report.    Safety Planning and Suicide Prevention discussed:  Yes,  SPE not required for this pt. SPI pamhplet provided to pt and he was encouraged to share with his support network.   Patrick Estrada, Patrick Estrada 12/14/2012, 11:03 AM

## 2012-12-14 NOTE — Tx Team (Signed)
Interdisciplinary Treatment Plan Update (Adult)  Date: 12/14/2012    Time Reviewed: 10:58 AM  Progress in Treatment:  Attending groups: Yes Participating in groups:  Yes Taking medication as prescribed: Yes  Tolerating medication: Yes  Family/Significant othe contact made: No. SPE not required for this pt.  Patient understands diagnosis: Yes, AEB seeking treatment for ETOH detox and depression.  Discussing patient identified problems/goals with staff: Yes  Medical problems stabilized or resolved: Yes  Denies suicidal/homicidal ideation: Yes during admission and group/self report.  Patient has not harmed self or Others: Yes  New problem(s) identified: n/a  Discharge Plan or Barriers: Pt plans to follow up at [facility] for med management and group therapy. He is not interested in any other referrals or services. Pt will have a friend pick him up today. He plans to return home upon d/c.  Additional comments: Drinking since the age of 45, 88-9 quarts of beer daily with occasional marijuana and cocaine use. Depression and anxiety present. Last drink was four days ago. He is tired of feeling like he does on alcohol. [facility] for 30 day rehab in the past x 1 and one detox at [facility] in the past. Injury to the left side of his face years ago resulting in issues with hearing. He currently resides with his sister and can return. [name] has two adult daughters and one son who died at the age of one, born prematurely. He has guilt because the day before he died (he had not been sick), he wished he did not have him because he did not want to deal with the baby mother. [name] feels God did not feel like he needed his son. Reason for Continuation of Hospitalization: d/c today Estimated length of stay: d/c today For review of initial/current patient goals, please see plan of care.  Attendees:  Patient:    Family:    Physician: Geoffery Lyons MD 12/14/2012 10:58 AM   Nursing: Lupita Leash RN  12/14/2012 10:58 AM  Clinical  Social Worker Galena Logie Smart, LCSWA  12/14/2012 10:58 AM   Other: Sue Lush RN 12/14/2012 10:58 AM   Other: Chandra Batch. PA 12/14/2012 10:58 AM   Other: Darden Dates Nurse CM  12/14/2012 10:58 AM   Other:    Scribe for Treatment Team:  [employer] LCSWA  12/14/2012 10:59 AM

## 2012-12-14 NOTE — Progress Notes (Signed)
D/C instructions/meds/follow-up appointments reviewed, pt verbalized understanding, pt's belongings returned to pt, denies SI/HI/AVH. 

## 2012-12-14 NOTE — Progress Notes (Signed)
Adult Psychoeducational Group Note  Date:  12/14/2012 Time:  1:20 PM  Group Topic/Focus:  Self Care:   The focus of this group is to help patients understand the importance of self-care in order to improve or restore emotional, physical, spiritual, interpersonal, and financial health.  Participation Level:  Minimal  Participation Quality:  Attentive  Affect:  Flat  Cognitive:  Alert  Insight: Lacking  Engagement in Group:  Engaged  Modes of Intervention:  Activity, Discussion, Exploration, Socialization and Support  Additional Comments:  Pt came to group and filled out a questionnaire and scored the highest in physical self-care and the lowest in emotional self-care. Pt plans on improving his emotional self-care by loving himself, staying in contact with his family and friends and crying more.  Cathlean Cower 12/14/2012, 1:20 PM

## 2012-12-14 NOTE — [facility] Suicide Risk Assessment (Signed)
[facility] INPATIENT: Family/Significant Other Suicide Prevention Education  Suicide Prevention Education:  Education Completed; No one has been identified by the patient as the family member/significant other with whom the patient will be residing, and identified as the person(s) who will aid the patient in the event of a mental health crisis (suicidal ideations/suicide attempt).   Pt did not c/o SI at admission, nor have they endorsed SI during their stay here. SPE not required. SPI pamphlet provided to pt and he was encouraged to share with his support network.   [employer], LCSWA 12/14/2012 11:03 AM

## 2012-12-14 NOTE — [facility] Suicide Risk Assessment (Signed)
Suicide Risk Assessment  Discharge Assessment     Demographic Factors:  Male  Mental Status Per Nursing Assessment::   On Admission:  NA  Current Mental Status by Physician: In full contact with reality. There are no suicidal ideas, plans or intent. His mood is euthymic, his affect is restricted. States he is not having any more withdrawal. He feels ready to be D/C home. Will follow up outpatient basis   Loss Factors: NA  Historical Factors: NA  Risk Reduction Factors:   wants to do better  Continued Clinical Symptoms:  Alcohol/Substance Abuse/Dependencies  Cognitive Features That Contribute To Risk:  Closed-mindedness Polarized thinking Thought constriction (tunnel vision)    Suicide Risk:  Minimal: No identifiable suicidal ideation.  Patients presenting with no risk factors but with morbid ruminations; may be classified as minimal risk based on the severity of the depressive symptoms  Discharge Diagnoses:   AXIS I:  Alcohol Dependence, S/P alcohol withdrawal AXIS II:  Deferred AXIS III:   Past Medical History  Diagnosis Date  . Arthritis   . Asthma    AXIS IV:  other psychosocial or environmental problems AXIS V:  61-70 mild symptoms  Plan Of Care/Follow-up recommendations:  Activity:  as tolerated Diet:  regular Follow up outpatient basis Is patient on multiple antipsychotic therapies at discharge:  No   Has Patient had three or more failed trials of antipsychotic monotherapy by history:  No  Recommended Plan for Multiple Antipsychotic Therapies: NA  Callum Wolf A 12/14/2012, 1:32 PM

## 2012-12-14 NOTE — Discharge Summary (Signed)
Physician Discharge Summary Note  Patient:  Patrick Estrada is an 52 y.o., male MRN:  [id number] DOB:  1960/04/30 Patient phone:  [phone] (home)  Patient address:   [address] [place] [address],   Date of Admission:  12/11/2012 Date of Discharge: 12/14/12  Reason for Admission:  Alcohol detox  Discharge Diagnoses: Principal Problem:   Alcohol dependence Active Problems:   Alcohol withdrawal  Review of Systems  Constitutional: Negative.   HENT: Negative.   Eyes: Negative.   Respiratory: Negative.   Cardiovascular: Negative.   Gastrointestinal: Negative.   Genitourinary: Negative.   Musculoskeletal: Negative.   Skin: Negative.   Neurological: Negative.   Endo/Heme/Allergies: Negative.   Psychiatric/Behavioral: Positive for substance abuse (Alcoholism). Negative for depression, suicidal ideas, hallucinations and memory loss. The patient is nervous/anxious (Stabilized with medication prior to discharge) and has insomnia (Stabilized with medication prior to discharge).     DSM5: Schizophrenia Disorders:  NA Obsessive-Compulsive Disorders:  NA Trauma-Stressor Disorders:  NA Substance/Addictive Disorders:  Alcohol Withdrawal (291.81), Alcohol dependence Depressive Disorders:  NA  Axis Diagnosis:   AXIS I:  Alcohol Withdrawal (291.81), Alcohol dependence AXIS II:  Deferred AXIS III:   Past Medical History  Diagnosis Date  . Arthritis   . Asthma    AXIS IV:  other psychosocial or environmental problems and Alcoholism AXIS V:  61-70 mild symptoms  Level of Care:  OP  Hospital Course:  Drinking since the age of 61, 57-9 quarts of beer daily with occasional marijuana and cocaine use. Depression and anxiety present. Last drink was four days ago. He is tired of feeling like he does on alcohol. [facility] for 30 day rehab in the past x 1 and one detox at [facility] in the past. Injury to the left side of his face years ago resulting in issues with hearing. He currently  resides with his sister and can return. Mr. Bowlby has two adult daughters and one son who died at the age of one, born prematurely. He has guilt because the day before he died (he had not been sick), he wished he did not have him because he did not want to deal with the baby mother. Mr. Gomer feels God did not feel like he needed his son.  Mr. Terrio came in to the hospital with a blood alcohol level of 66 per toxicology reports and UDS report indicated positiveTHC in his systems. It was obvious that he was intoxicated and showing withdrawal symptoms of alcohol needing detoxification treatment. He was then started on lorazepam (Ativan) 1 mg detoxification treatment protocol on a tapering dose for his alcohol detoxification.This was chosen in place of Librium detoxification treatment protocol because of liver compromization.This way the patient will receive a much cleaner detoxification treatment without endangering his liver functions any further. Per lab reports, patient's liver enzymes were elevated. Besides the detoxification treatment protocol. Shean was also enrolled in group counseling sessions and activities where he was counseled, taught and learned coping skills that should help him after discharge to cope better and manage his substance abuse issues for a much sustainable sobriety.   Mr. Pinkerton was also enrolled/attended AA/NA meetings being offered and held on this unit. He has no other previous and or identifiable medical conditions that required treatment and or monitoring. However, he was monitored closely for any potential problems that may arise as a result of and or during detoxification treatment. Patient tolerated his detoxification treatment without any significant adverse effects and or reactions reported.  Patient attended treatment team meeting this am and met with the treatment team members. His reason for admission, symptoms, substance abuse issues, response to to treatment and discharge  plans discussed. Patient endorsed that he is doing well and stable for discharge to pursue the next phase of his substance abuse treatment on an outpatient basis. He states that he has no other withdrawal symptoms. He will follow-up care at the [facility] clinic here in [place], [place]. He has been informed that this is a walk-in appointment between hours of 08-09:00 am, Monday thru Friday. The address and contact information for [facility] provided for patient in writing.  He was also instructed/encouraged to join/attend AA meetings being offered and held within his community. He was instructed to get a trusted sponsor from the advise of others or from whomever within the AA meetings seems to make sense, and has a proven track record, and will hold him responsible for his sobriety, and both expects and insists on his total astinence from alcohol.   Mr. Dubey is currently being discharged to his home. Upon discharge, patient adamantly denies suicidal, homicidal ideations, auditory, visual hallucinations, delusional thinking and or withdrawal symptoms. Patient left [facility] with all personal belongings in no apparent distress. Transportation per friend.  Consults:  psychiatry  Significant Diagnostic Studies:  labs: CBC with diff, CMP, UDS, Toxicology tests, U/A  Discharge Vitals:   Blood pressure 124/94, pulse 86, temperature 98.4 F (36.9 C), temperature source Oral, resp. rate 16, height 5' 9.69" (1.77 m), weight 74.39 kg (164 lb). Body mass index is 23.74 kg/(m^2). Lab Results:   No results found for this or any previous visit (from the past 72 hour(s)).  Physical Findings: AIMS: Facial and Oral Movements Muscles of Facial Expression: None, normal Lips and Perioral Area: None, normal Jaw: None, normal Tongue: None, normal,Extremity Movements Upper (arms, wrists, hands, fingers): None, normal Lower (legs, knees, ankles, toes): None, normal, Trunk Movements Neck, shoulders,  hips: None, normal, Overall Severity Severity of abnormal movements (highest score from questions above): Mild Incapacitation due to abnormal movements: Minimal Patient's awareness of abnormal movements (rate only patient's report): Aware, no distress, Dental Status Current problems with teeth and/or dentures?: Yes Does patient usually wear dentures?: Yes  CIWA:  CIWA-Ar Total: 1 COWS:     Psychiatric Specialty Exam: See Psychiatric Specialty Exam and Suicide Risk Assessment completed by Attending Physician prior to discharge.  Discharge destination:  Home  Is patient on multiple antipsychotic therapies at discharge:  No   Has Patient had three or more failed trials of antipsychotic monotherapy by history:  No  Recommended Plan for Multiple Antipsychotic Therapies: NA     Medication List       Indication   albuterol 108 (90 BASE) MCG/ACT inhaler  Commonly known as:  PROVENTIL HFA;VENTOLIN HFA  Inhale 2 puffs into the lungs every 4 (four) hours as needed for wheezing or shortness of breath (d/c home with pt).   Indication:  Asthma       Follow-up Information   Follow up with [facility]. (Walk in between 8AM-9AM Monday through Friday for hospital follow-up/medication management. )    Contact information:   [address], [place] [address] Phone: [phone] Fax: [phone]    Follow-up recommendations:  Activity:  As tolerated Diet: As recommended by your primary care doctor. Keep all scheduled follow-up appointments as recommended. Continue to work your relapse prevention plan Comments:  Take all your medications as prescribed by your mental healthcare provider.  Report any adverse effects and or reactions from your medicines to your outpatient provider promptly. Patient is instructed and cautioned to not engage in alcohol and or illegal drug use while on prescription medicines. In the event of worsening symptoms, patient is instructed to call the crisis hotline,  911 and or go to the nearest ED for appropriate evaluation and treatment of symptoms. Follow-up with your primary care provider for your other medical issues, concerns and or health care needs.   Total Discharge Time:  Greater than 30 minutes.  Signed: Sanjuana Kava, PMHNP-BC 12/14/2012, 4:39 PM Agree with assessment and plan Madie Reno A. Dub Mikes, M.D.

## 2012-12-14 NOTE — [facility] Group Notes (Signed)
[facility] LCSW Aftercare Discharge Planning Group Note   12/14/2012 9:31 AM  Participation Quality:  Appropriate   Mood/Affect:  Appropriate  Depression Rating:  0  Anxiety Rating:  0  Thoughts of Suicide:  No Will you contract for safety?   NA  Current AVH:  No  Plan for Discharge/Comments:  Pt reports that he is wanting to be set up with o/p therapy and psychiatrist after d/c. He lives with his sister and is able to return home after d/c. Pt reports that "my meds are working well." He is hoping to d/c today.   Transportation Means: unknown at this time.   Supports: no supports identified by pt.   Smart, [employer]

## 2012-12-17 NOTE — Progress Notes (Signed)
Patient Discharge Instructions:  After Visit Summary (AVS):   Faxed to:  12/17/12 Discharge Summary Note:   Faxed to:  12/17/12 Psychiatric Admission Assessment Note:   Faxed to:  12/17/12 Suicide Risk Assessment - Discharge Assessment:   Faxed to:  12/17/12 Faxed/Sent to the Next Level Care provider:  12/17/12 Faxed to [facility] @ [phone]  Jerelene Redden, 12/17/2012, 3:28 PM

## 2012-12-17 NOTE — Consult Note (Signed)
Note reviewed and agreed with  

## 2014-06-02 ENCOUNTER — Emergency Department (HOSPITAL_COMMUNITY)
Admission: EM | Admit: 2014-06-02 | Discharge: 2014-06-02 | Disposition: A | Discharge status: Home / Self Care | Payer: Self-pay | Attending: Emergency Medicine | Admitting: Emergency Medicine

## 2014-06-02 ENCOUNTER — Emergency Department (HOSPITAL_COMMUNITY): Payer: Self-pay

## 2014-06-02 ENCOUNTER — Encounter (HOSPITAL_COMMUNITY): Payer: Self-pay | Admitting: Physical Medicine and Rehabilitation

## 2014-06-02 DIAGNOSIS — Y9289 Other specified places as the place of occurrence of the external cause: Secondary | ICD-10-CM | POA: Insufficient documentation

## 2014-06-02 DIAGNOSIS — J45909 Unspecified asthma, uncomplicated: Secondary | ICD-10-CM | POA: Insufficient documentation

## 2014-06-02 DIAGNOSIS — S0993XA Unspecified injury of face, initial encounter: Secondary | ICD-10-CM | POA: Insufficient documentation

## 2014-06-02 DIAGNOSIS — Y998 Other external cause status: Secondary | ICD-10-CM | POA: Insufficient documentation

## 2014-06-02 DIAGNOSIS — R55 Syncope and collapse: Secondary | ICD-10-CM | POA: Insufficient documentation

## 2014-06-02 DIAGNOSIS — Z79899 Other long term (current) drug therapy: Secondary | ICD-10-CM | POA: Insufficient documentation

## 2014-06-02 DIAGNOSIS — Z720 Tobacco use: Secondary | ICD-10-CM | POA: Insufficient documentation

## 2014-06-02 DIAGNOSIS — Y9389 Activity, other specified: Secondary | ICD-10-CM | POA: Insufficient documentation

## 2014-06-02 DIAGNOSIS — W01198A Fall on same level from slipping, tripping and stumbling with subsequent striking against other object, initial encounter: Secondary | ICD-10-CM | POA: Insufficient documentation

## 2014-06-02 DIAGNOSIS — Z8739 Personal history of other diseases of the musculoskeletal system and connective tissue: Secondary | ICD-10-CM | POA: Insufficient documentation

## 2014-06-02 LAB — COMPREHENSIVE METABOLIC PANEL
ALK PHOS: 43 U/L (ref 39–117)
ALT: 74 U/L — AB (ref 0–53)
ANION GAP: 10 (ref 5–15)
AST: 77 U/L — ABNORMAL HIGH (ref 0–37)
Albumin: 4.1 g/dL (ref 3.5–5.2)
BILIRUBIN TOTAL: 0.9 mg/dL (ref 0.3–1.2)
BUN: 10 mg/dL (ref 6–23)
CALCIUM: 9 mg/dL (ref 8.4–10.5)
CHLORIDE: 107 mmol/L (ref 96–112)
CO2: 21 mmol/L (ref 19–32)
CREATININE: 1.06 mg/dL (ref 0.50–1.35)
GFR calc Af Amer: >90 mL/min (ref >90)
GFR calc non Af Amer: 78 mL/min — ABNORMAL LOW (ref >90)
Glucose, Bld: 81 mg/dL (ref 70–99)
POTASSIUM: 4.1 mmol/L (ref 3.5–5.1)
SODIUM: 138 mmol/L (ref 135–145)
Total Protein: 7.5 g/dL (ref 6.0–8.3)

## 2014-06-02 LAB — CBC WITH DIFFERENTIAL/PLATELET
BASOS ABS: 0 10*3/uL (ref 0.0–0.1)
BASOS PCT: 0 % (ref 0–1)
EOS ABS: 0 10*3/uL (ref 0.0–0.7)
Eosinophils Relative: 0 % (ref 0–5)
HCT: 44.5 % (ref 39.0–52.0)
Hemoglobin: 15.1 g/dL (ref 13.0–17.0)
LYMPHS ABS: 1.7 10*3/uL (ref 0.7–4.0)
LYMPHS PCT: 20 % (ref 12–46)
MCH: 30.8 pg (ref 26.0–34.0)
MCHC: 33.9 g/dL (ref 30.0–36.0)
MCV: 90.6 fL (ref 78.0–100.0)
MONO ABS: 0.8 10*3/uL (ref 0.1–1.0)
Monocytes Relative: 10 % (ref 3–12)
Neutro Abs: 6 10*3/uL (ref 1.7–7.7)
Neutrophils Relative %: 70 % (ref 43–77)
PLATELETS: 201 10*3/uL (ref 150–400)
RBC: 4.91 MIL/uL (ref 4.22–5.81)
RDW: 14.2 % (ref 11.5–15.5)
WBC: 8.6 10*3/uL (ref 4.0–10.5)

## 2014-06-02 LAB — URINALYSIS, ROUTINE W REFLEX MICROSCOPIC
Bilirubin Urine: NEGATIVE
GLUCOSE, UA: NEGATIVE mg/dL
Hgb urine dipstick: NEGATIVE
KETONES UR: NEGATIVE mg/dL
Leukocytes, UA: NEGATIVE
Nitrite: NEGATIVE
PROTEIN: NEGATIVE mg/dL
SPECIFIC GRAVITY, URINE: 1.02 (ref 1.005–1.030)
Urobilinogen, UA: 0.2 mg/dL (ref 0.0–1.0)
pH: 5 (ref 5.0–8.0)

## 2014-06-02 LAB — POC OCCULT BLOOD, ED: FECAL OCCULT BLD: NEGATIVE

## 2014-06-02 LAB — I-STAT TROPONIN, ED: TROPONIN I, POC: 0 ng/mL (ref 0.00–0.08)

## 2014-06-02 MED ORDER — HYDROMORPHONE HCL 1 MG/ML IJ SOLN
1.0000 mg | Freq: Once | INTRAMUSCULAR | Status: AC
  Administered 2014-06-02: 1 mg via INTRAVENOUS
  Filled 2014-06-02: qty 1

## 2014-06-02 MED ORDER — TRAMADOL HCL 50 MG PO TABS: 50.0000 mg | ORAL_TABLET | Freq: Four times a day (QID) | ORAL | Status: DC | PRN

## 2014-06-02 MED ORDER — HYDROMORPHONE HCL 1 MG/ML IJ SOLN: 1.0000 mg | Freq: Once | INTRAMUSCULAR | Status: DC

## 2014-06-02 MED ORDER — ONDANSETRON HCL 4 MG/2ML IJ SOLN
4.0000 mg | INTRAMUSCULAR | Status: AC
  Administered 2014-06-02: 4 mg via INTRAVENOUS
  Filled 2014-06-02: qty 2

## 2014-06-02 MED ORDER — SODIUM CHLORIDE 0.9 % IV BOLUS (SEPSIS)
1000.0000 mL | INTRAVENOUS | Status: AC
  Administered 2014-06-02: 1000 mL via INTRAVENOUS

## 2014-06-02 NOTE — ED Notes (Signed)
Pt presents to department for evaluation of syncope. States he passed out and fell, struck face on bathtub. Occurred at 0700 this morning. Reports bleeding inside of mouth. Pt is alert and oriented x4. No neurological deficits noted.

## 2014-06-02 NOTE — ED Provider Notes (Signed)
CSN: [id number]     Arrival date & time 06/02/14  1247 History   First MD Initiated Contact with Patient 06/02/14 1404     Chief Complaint  Patient presents with  . Facial Pain  . Loss of Consciousness   (Consider location/radiation/quality/duration/timing/severity/associated sxs/prior Treatment) HPI  Patrick Estrada is a 54 year old male presenting with report of syncopal episode. He states this morning he stood up after getting out of bed and ambulated to the bathroom. While stretching and coughing as he normally does, he suddenly remembers blacking out. He recalls hitting his face on the bathtub. When he was able to stand up, he was spitting out blood from his teeth.  He reports pain in his right jaw and teeth. He denies any recent illnesses, nausea or vomiting but does report 2 days of bloody stools 3 days ago. He reports his last bm was today and was normal. He denies any chest pain or shortness of breath prior to or after the episode.   Past Medical History  Diagnosis Date  . Arthritis   . Asthma    History reviewed. No pertinent past surgical history. History reviewed. No pertinent family history. History  Substance Use Topics  . Smoking status: Current Some Day Smoker -- 1.50 packs/day for 10 years    Types: Cigarettes  . Smokeless tobacco: Not on file  . Alcohol Use: Yes    Review of Systems  Constitutional: Negative for fever and chills.  HENT: Positive for dental problem. Negative for sore throat.   Eyes: Negative for visual disturbance.  Respiratory: Negative for cough and shortness of breath.   Cardiovascular: Negative for chest pain and leg swelling.  Gastrointestinal: Negative for nausea, vomiting and diarrhea.  Genitourinary: Negative for dysuria.  Musculoskeletal: Negative for myalgias.  Skin: Positive for wound. Negative for rash.  Neurological: Positive for syncope. Negative for weakness, numbness and headaches.      Allergies  Review of patient's allergies  indicates no known allergies.  Home Medications   Prior to Admission medications   Medication Sig Start Date End Date Taking? Authorizing Provider  albuterol (PROVENTIL HFA;VENTOLIN HFA) 108 (90 BASE) MCG/ACT inhaler Inhale 2 puffs into the lungs every 4 (four) hours as needed for wheezing or shortness of breath (d/c home with pt). 12/14/12   Sanjuana Kava, NP   BP 136/99 mmHg  Pulse 81  Temp(Src) 97.9 F (36.6 C) (Oral)  Resp 14  SpO2 100% Physical Exam  Constitutional: He is oriented to person, place, and time. He appears well-developed and well-nourished. No distress.  HENT:  Head: Normocephalic.  Missing dentition, as per normal but bruising, bleeding and incised injuries to gums.  Pt reports decreased ROM to jaw due to pain.   Eyes: Conjunctivae and EOM are normal. Pupils are equal, round, and reactive to light.  Neck: Normal range of motion. Neck supple.  Cardiovascular: Normal rate, regular rhythm and intact distal pulses.   Pulmonary/Chest: Effort normal and breath sounds normal. No respiratory distress. He has no wheezes. He has no rales. He exhibits no tenderness.  Abdominal: Soft. There is no tenderness.  Musculoskeletal: He exhibits no tenderness.  Lymphadenopathy:    He has no cervical adenopathy.  Neurological: He is alert and oriented to person, place, and time. He has normal strength. No cranial nerve deficit or sensory deficit. Coordination normal. GCS eye subscore is 4. GCS verbal subscore is 5. GCS motor subscore is 6.  Cranial nerves 2-12 intact.  Skin: Skin is warm and  dry. No rash noted. He is not diaphoretic.  Psychiatric: He has a normal mood and affect.  Nursing note and vitals reviewed.   ED Course  Procedures (including critical care time) Labs Review Labs Reviewed  CBC WITH DIFFERENTIAL/PLATELET  URINALYSIS, ROUTINE W REFLEX MICROSCOPIC  COMPREHENSIVE METABOLIC PANEL  POC OCCULT BLOOD, ED  I-STAT TROPOININ, ED    Imaging Review No results  found.   EKG Interpretation   Date/Time:  Thursday June 02 2014 13:00:07 EST Ventricular Rate:  101 PR Interval:  128 QRS Duration: 86 QT Interval:  346 QTC Calculation: 448 R Axis:   -36 Text Interpretation:  Sinus tachycardia Left axis deviation Abnormal ECG  Mild STE in precorial leads, likely early repolarization No prior for  comparison Confirmed by DOCHERTY  MD, MEGAN ([id number]) on 06/02/2014 3:41:32 PM      MDM   Final diagnoses:  None   54 yo with syncopal episode resulting in fall with facial impact.  Pt with poor dentition and gingival bleeding and decreased mandible ROM. Discussed case with Dr. Micheline Mazeocherty.  IVF bolus, pain meds, CBC, CMP, Troponin, UA, EKG, Hemoccult, CXR, CT head and maxillofacial.   At change of shift, hand-off report given to Marlon Peliffany Greene, PA-C.  Plan includes review results of labs and imaging and appropriately disposition.    Filed Vitals:   06/02/14 1258 06/02/14 1345  BP: 132/94 136/99  Pulse: 101 81  Temp: 97.9 F (36.6 C)   TempSrc: Oral   Resp: 18 14  SpO2: 99% 100%   Meds given in ED:  Medications  sodium chloride 0.9 % bolus 1,000 mL (1,000 mLs Intravenous New Bag/Given 06/02/14 1450)  HYDROmorphone (DILAUDID) injection 1 mg (1 mg Intravenous Given 06/02/14 1446)  ondansetron (ZOFRAN) injection 4 mg (4 mg Intravenous Given 06/02/14 1446)    New Prescriptions   No medications on file       Harle BattiestElizabeth Joyann Spidle, NP 06/02/14 2152  Toy CookeyMegan Docherty, MD 06/03/14 [id number]

## 2014-06-02 NOTE — Discharge Instructions (Signed)
Syncope °Syncope is a medical term for fainting or passing out. This means you lose consciousness and drop to the ground. People are generally unconscious for less than 5 minutes. You may have some muscle twitches for up to 15 seconds before waking up and returning to normal. Syncope occurs more often in older adults, but it can happen to anyone. While most causes of syncope are not dangerous, syncope can be a sign of a serious medical problem. It is important to seek medical care.  °CAUSES  °Syncope is caused by a sudden drop in blood flow to the brain. The specific cause is often not determined. Factors that can bring on syncope include: °· Taking medicines that lower blood pressure. °· Sudden changes in posture, such as standing up quickly. °· Taking more medicine than prescribed. °· Standing in one place for too long. °· Seizure disorders. °· Dehydration and excessive exposure to heat. °· Low blood sugar (hypoglycemia). °· Straining to have a bowel movement. °· Heart disease, irregular heartbeat, or other circulatory problems. °· Fear, emotional distress, seeing blood, or severe pain. °SYMPTOMS  °Right before fainting, you may: °· Feel dizzy or light-headed. °· Feel nauseous. °· See all white or all black in your field of vision. °· Have cold, clammy skin. °DIAGNOSIS  °Your health care provider will ask about your symptoms, perform a physical exam, and perform an electrocardiogram (ECG) to record the electrical activity of your heart. Your health care provider may also perform other heart or blood tests to determine the cause of your syncope which may include: °· Transthoracic echocardiogram (TTE). During echocardiography, sound waves are used to evaluate how blood flows through your heart. °· Transesophageal echocardiogram (TEE). °· Cardiac monitoring. This allows your health care provider to monitor your heart rate and rhythm in real time. °· Holter monitor. This is a portable device that records your  heartbeat and can help diagnose heart arrhythmias. It allows your health care provider to track your heart activity for several days, if needed. °· Stress tests by exercise or by giving medicine that makes the heart beat faster. °TREATMENT  °In most cases, no treatment is needed. Depending on the cause of your syncope, your health care provider may recommend changing or stopping some of your medicines. °HOME CARE INSTRUCTIONS °· Have someone stay with you until you feel stable. °· Do not drive, use machinery, or play sports until your health care provider says it is okay. °· Keep all follow-up appointments as directed by your health care provider. °· Lie down right away if you start feeling like you might faint. Breathe deeply and steadily. Wait until all the symptoms have passed. °· Drink enough fluids to keep your urine clear or pale yellow. °· If you are taking blood pressure or heart medicine, get up slowly and take several minutes to sit and then stand. This can reduce dizziness. °SEEK IMMEDIATE MEDICAL CARE IF:  °· You have a severe headache. °· You have unusual pain in the chest, abdomen, or back. °· You are bleeding from your mouth or rectum, or you have black or tarry stool. °· You have an irregular or very fast heartbeat. °· You have pain with breathing. °· You have repeated fainting or seizure-like jerking during an episode. °· You faint when sitting or lying down. °· You have confusion. °· You have trouble walking. °· You have severe weakness. °· You have vision problems. °If you fainted, call your local emergency services (911 in [place] Do not drive   yourself to the hospital.  °MAKE SURE YOU: °· Understand these instructions. °· Will watch your condition. °· Will get help right away if you are not doing well or get worse. °Document Released: 03/11/2005 Document Revised: 03/16/2013 Document Reviewed: 05/10/2011 °[employer]® Patient Information ©2015 [employer]. This information is not intended to replace  advice given to you by your health care provider. Make sure you discuss any questions you have with your health care provider. ° °
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
## 2015-03-22 ENCOUNTER — Emergency Department (HOSPITAL_COMMUNITY): Payer: Self-pay

## 2015-03-22 ENCOUNTER — Emergency Department (HOSPITAL_COMMUNITY)
Admission: EM | Admit: 2015-03-22 | Discharge: 2015-03-22 | Disposition: A | Discharge status: Home / Self Care | Payer: Self-pay | Attending: Emergency Medicine | Admitting: Emergency Medicine

## 2015-03-22 ENCOUNTER — Encounter (HOSPITAL_COMMUNITY): Payer: Self-pay | Admitting: Emergency Medicine

## 2015-03-22 DIAGNOSIS — S022XXA Fracture of nasal bones, initial encounter for closed fracture: Secondary | ICD-10-CM

## 2015-03-22 DIAGNOSIS — J45909 Unspecified asthma, uncomplicated: Secondary | ICD-10-CM | POA: Insufficient documentation

## 2015-03-22 DIAGNOSIS — S0181XA Laceration without foreign body of other part of head, initial encounter: Secondary | ICD-10-CM | POA: Insufficient documentation

## 2015-03-22 DIAGNOSIS — Y9389 Activity, other specified: Secondary | ICD-10-CM | POA: Insufficient documentation

## 2015-03-22 DIAGNOSIS — F17210 Nicotine dependence, cigarettes, uncomplicated: Secondary | ICD-10-CM | POA: Insufficient documentation

## 2015-03-22 DIAGNOSIS — Y9289 Other specified places as the place of occurrence of the external cause: Secondary | ICD-10-CM | POA: Insufficient documentation

## 2015-03-22 DIAGNOSIS — S0231XA Fracture of orbital floor, right side, initial encounter for closed fracture: Secondary | ICD-10-CM | POA: Insufficient documentation

## 2015-03-22 DIAGNOSIS — Z23 Encounter for immunization: Secondary | ICD-10-CM | POA: Insufficient documentation

## 2015-03-22 DIAGNOSIS — Z79899 Other long term (current) drug therapy: Secondary | ICD-10-CM | POA: Insufficient documentation

## 2015-03-22 DIAGNOSIS — Z8739 Personal history of other diseases of the musculoskeletal system and connective tissue: Secondary | ICD-10-CM | POA: Insufficient documentation

## 2015-03-22 DIAGNOSIS — Y998 Other external cause status: Secondary | ICD-10-CM | POA: Insufficient documentation

## 2015-03-22 DIAGNOSIS — S023XXA Fracture of orbital floor, initial encounter for closed fracture: Secondary | ICD-10-CM

## 2015-03-22 DIAGNOSIS — F10129 Alcohol abuse with intoxication, unspecified: Secondary | ICD-10-CM | POA: Insufficient documentation

## 2015-03-22 MED ORDER — FLUORESCEIN SODIUM 1 MG OP STRP
1.0000 | ORAL_STRIP | Freq: Once | OPHTHALMIC | Status: AC
  Administered 2015-03-22: 1 via OPHTHALMIC
  Filled 2015-03-22: qty 1

## 2015-03-22 MED ORDER — TETANUS-DIPHTH-ACELL PERTUSSIS 5-2.5-18.5 LF-MCG/0.5 IM SUSP
0.5000 mL | Freq: Once | INTRAMUSCULAR | Status: AC
  Administered 2015-03-22: 0.5 mL via INTRAMUSCULAR
  Filled 2015-03-22: qty 0.5

## 2015-03-22 MED ORDER — LIDOCAINE-EPINEPHRINE 2 %-1:100000 IJ SOLN
20.0000 mL | Freq: Once | INTRAMUSCULAR | Status: AC
  Administered 2015-03-22: 20 mL via INTRADERMAL
  Filled 2015-03-22: qty 1

## 2015-03-22 MED ORDER — IBUPROFEN 800 MG PO TABS: 800.0000 mg | ORAL_TABLET | Freq: Three times a day (TID) | ORAL | Status: DC
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
## 2015-03-22 NOTE — ED Notes (Signed)
Fluorescein and Lido at bedside.

## 2015-03-22 NOTE — ED Notes (Signed)
PA at bedside.
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
## 2015-03-22 NOTE — ED Provider Notes (Signed)
Dr. Jearld FentonByers called me back and stated that the patient contacted his office today and told his staff that someone here in the ED told the patient that he was going to go blind if he didn't see the ENT surgeon today. This was not the information communicated with this patient by myself. The patient was told that it was important for his health and health of his eyes to keep the appointment with the ophthalmologist and to make an appointment with the ENT surgeon sometime in the next 2 days. The patient and his sister were given this information, acknowledge the information, and even repeated this information back to me. Care was taken to stress the importance of keeping these appointments because there was suspicion that the patient might have otherwise been lost to follow-up. However, at no time was patient told that he was going blind or that he needed to meet with either physician today. All the information that was verbally told to the patient and his sister was contained in his discharge paperwork.    Patrick PancoastShawn C Destiney Sanabia, PA-C 03/22/15 1528  Alvira MondayErin Schlossman, MD 03/22/15 2141
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
## 2015-04-04 ENCOUNTER — Encounter (HOSPITAL_COMMUNITY): Payer: Self-pay

## 2015-04-04 DIAGNOSIS — F17200 Nicotine dependence, unspecified, uncomplicated: Secondary | ICD-10-CM | POA: Insufficient documentation

## 2015-04-04 DIAGNOSIS — H5711 Ocular pain, right eye: Secondary | ICD-10-CM | POA: Insufficient documentation

## 2015-04-04 DIAGNOSIS — Z8781 Personal history of (healed) traumatic fracture: Secondary | ICD-10-CM | POA: Insufficient documentation

## 2015-04-04 DIAGNOSIS — I10 Essential (primary) hypertension: Secondary | ICD-10-CM | POA: Insufficient documentation

## 2015-04-04 DIAGNOSIS — R51 Headache: Secondary | ICD-10-CM | POA: Insufficient documentation
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
## 2015-04-05 ENCOUNTER — Emergency Department (HOSPITAL_COMMUNITY)
Admission: EM | Admit: 2015-04-05 | Discharge: 2015-04-05 | Disposition: A | Discharge status: Home / Self Care | Payer: Self-pay | Attending: Emergency Medicine | Admitting: Emergency Medicine

## 2015-04-05 DIAGNOSIS — Z8781 Personal history of (healed) traumatic fracture: Secondary | ICD-10-CM

## 2015-04-05 DIAGNOSIS — R519 Headache, unspecified: Secondary | ICD-10-CM

## 2015-04-05 DIAGNOSIS — R51 Headache: Secondary | ICD-10-CM

## 2015-04-05 MED ORDER — HYDROCODONE-ACETAMINOPHEN 5-325 MG PO TABS
1.0000 | ORAL_TABLET | Freq: Once | ORAL | Status: AC
  Administered 2015-04-05: 1 via ORAL
  Filled 2015-04-05: qty 1

## 2015-04-05 MED ORDER — IBUPROFEN 800 MG PO TABS: 800.0000 mg | ORAL_TABLET | Freq: Three times a day (TID) | ORAL | Status: DC | PRN

## 2015-04-05 MED ORDER — IBUPROFEN 400 MG PO TABS
600.0000 mg | ORAL_TABLET | Freq: Once | ORAL | Status: AC
  Administered 2015-04-05: 600 mg via ORAL
  Filled 2015-04-05: qty 1

## 2015-04-05 NOTE — ED Notes (Signed)
Dr. Wilkie Aye back in to speak with patient.
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
## 2015-04-05 NOTE — ED Notes (Signed)
Dr. Horton at the bedside.  
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
## 2015-04-06 ENCOUNTER — Encounter (HOSPITAL_COMMUNITY): Payer: Self-pay | Admitting: Emergency Medicine

## 2016-06-20 ENCOUNTER — Emergency Department (HOSPITAL_COMMUNITY): Payer: Self-pay

## 2016-06-20 ENCOUNTER — Encounter (HOSPITAL_COMMUNITY): Payer: Self-pay | Admitting: Emergency Medicine

## 2016-06-20 DIAGNOSIS — Y939 Activity, unspecified: Secondary | ICD-10-CM | POA: Insufficient documentation

## 2016-06-20 DIAGNOSIS — S2242XA Multiple fractures of ribs, left side, initial encounter for closed fracture: Secondary | ICD-10-CM | POA: Insufficient documentation

## 2016-06-20 DIAGNOSIS — Y999 Unspecified external cause status: Secondary | ICD-10-CM | POA: Insufficient documentation

## 2016-06-20 DIAGNOSIS — M25521 Pain in right elbow: Secondary | ICD-10-CM | POA: Insufficient documentation

## 2016-06-20 DIAGNOSIS — R109 Unspecified abdominal pain: Secondary | ICD-10-CM | POA: Insufficient documentation

## 2016-06-20 DIAGNOSIS — S301XXA Contusion of abdominal wall, initial encounter: Secondary | ICD-10-CM | POA: Insufficient documentation

## 2016-06-20 DIAGNOSIS — S0003XA Contusion of scalp, initial encounter: Secondary | ICD-10-CM | POA: Insufficient documentation

## 2016-06-20 DIAGNOSIS — M542 Cervicalgia: Secondary | ICD-10-CM | POA: Insufficient documentation

## 2016-06-20 DIAGNOSIS — I10 Essential (primary) hypertension: Secondary | ICD-10-CM | POA: Insufficient documentation

## 2016-06-20 DIAGNOSIS — F17200 Nicotine dependence, unspecified, uncomplicated: Secondary | ICD-10-CM | POA: Insufficient documentation

## 2016-06-20 DIAGNOSIS — Y9289 Other specified places as the place of occurrence of the external cause: Secondary | ICD-10-CM | POA: Insufficient documentation

## 2016-06-20 DIAGNOSIS — R220 Localized swelling, mass and lump, head: Secondary | ICD-10-CM | POA: Insufficient documentation

## 2016-06-20 DIAGNOSIS — J45909 Unspecified asthma, uncomplicated: Secondary | ICD-10-CM | POA: Insufficient documentation

## 2016-06-20 LAB — CBC WITH DIFFERENTIAL/PLATELET
BASOS PCT: 0 %
Basophils Absolute: 0 10*3/uL (ref 0.0–0.1)
EOS ABS: 0 10*3/uL (ref 0.0–0.7)
Eosinophils Relative: 0 %
HCT: 47 % (ref 39.0–52.0)
Hemoglobin: 16.1 g/dL (ref 13.0–17.0)
LYMPHS ABS: 3.2 10*3/uL (ref 0.7–4.0)
Lymphocytes Relative: 35 %
MCH: 31.8 pg (ref 26.0–34.0)
MCHC: 34.3 g/dL (ref 30.0–36.0)
MCV: 92.9 fL (ref 78.0–100.0)
Monocytes Absolute: 0.7 10*3/uL (ref 0.1–1.0)
Monocytes Relative: 7 %
NEUTROS PCT: 58 %
Neutro Abs: 5.3 10*3/uL (ref 1.7–7.7)
Platelets: 227 10*3/uL (ref 150–400)
RBC: 5.06 MIL/uL (ref 4.22–5.81)
RDW: 14.1 % (ref 11.5–15.5)
WBC: 9.2 10*3/uL (ref 4.0–10.5)

## 2016-06-20 LAB — COMPREHENSIVE METABOLIC PANEL
ALBUMIN: 3.9 g/dL (ref 3.5–5.0)
ALT: 45 U/L (ref 17–63)
AST: 117 U/L — ABNORMAL HIGH (ref 15–41)
Alkaline Phosphatase: 47 U/L (ref 38–126)
Anion gap: 13 (ref 5–15)
BUN: 14 mg/dL (ref 6–20)
CHLORIDE: 102 mmol/L (ref 101–111)
CO2: 21 mmol/L — AB (ref 22–32)
Calcium: 8.8 mg/dL — ABNORMAL LOW (ref 8.9–10.3)
Creatinine, Ser: 1.13 mg/dL (ref 0.61–1.24)
GFR calc non Af Amer: >60 mL/min (ref >60)
GLUCOSE: 87 mg/dL (ref 65–99)
Potassium: 4.6 mmol/L (ref 3.5–5.1)
SODIUM: 136 mmol/L (ref 135–145)
TOTAL PROTEIN: 7.5 g/dL (ref 6.5–8.1)
Total Bilirubin: 0.9 mg/dL (ref 0.3–1.2)
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
## 2016-06-20 NOTE — ED Notes (Signed)
Contact info if needed: Almira CoasterGina or sheila [phone]

## 2016-06-21 ENCOUNTER — Emergency Department (HOSPITAL_COMMUNITY): Payer: Self-pay

## 2016-06-21 ENCOUNTER — Emergency Department (HOSPITAL_COMMUNITY)
Admission: EM | Admit: 2016-06-21 | Discharge: 2016-06-21 | Disposition: A | Discharge status: Home / Self Care | Payer: Self-pay | Attending: Emergency Medicine | Admitting: Emergency Medicine

## 2016-06-21 DIAGNOSIS — S0990XA Unspecified injury of head, initial encounter: Secondary | ICD-10-CM

## 2016-06-21 DIAGNOSIS — S0003XA Contusion of scalp, initial encounter: Secondary | ICD-10-CM

## 2016-06-21 DIAGNOSIS — S2242XA Multiple fractures of ribs, left side, initial encounter for closed fracture: Secondary | ICD-10-CM

## 2016-06-21 DIAGNOSIS — T07XXXA Unspecified multiple injuries, initial encounter: Secondary | ICD-10-CM

## 2016-06-21 LAB — URINALYSIS, ROUTINE W REFLEX MICROSCOPIC
BILIRUBIN URINE: NEGATIVE
Glucose, UA: NEGATIVE mg/dL
HGB URINE DIPSTICK: NEGATIVE
KETONES UR: NEGATIVE mg/dL
Leukocytes, UA: NEGATIVE
NITRITE: NEGATIVE
PH: 5 (ref 5.0–8.0)
Protein, ur: NEGATIVE mg/dL
Specific Gravity, Urine: 1.008 (ref 1.005–1.030)

## 2016-06-21 LAB — ETHANOL: Alcohol, Ethyl (B): 141 mg/dL — ABNORMAL HIGH (ref <5)

## 2016-06-21 MED ORDER — IBUPROFEN 800 MG PO TABS
800.0000 mg | ORAL_TABLET | Freq: Once | ORAL | Status: AC
  Administered 2016-06-21: 800 mg via ORAL
  Filled 2016-06-21: qty 1

## 2016-06-21 MED ORDER — IBUPROFEN 800 MG PO TABS: 800.0000 mg | ORAL_TABLET | Freq: Three times a day (TID) | ORAL | 0 refills | Status: DC | PRN

## 2016-06-21 MED ORDER — IOPAMIDOL (ISOVUE-300) INJECTION 61%
INTRAVENOUS | Status: AC
  Administered 2016-06-21: 100 mL
  Filled 2016-06-21: qty 100
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
## 2016-06-21 NOTE — ED Notes (Signed)
Pt requested this RN call sister Velna Hatchet for ride home. This RN spoke with pt sister and she notified she would pay for cab ride home for pt. Pt is updated and ok with this plan.
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
## 2016-06-21 NOTE — ED Notes (Signed)
Pt ambulated while on the pulse ox.  Pt's SpO2 was steady at 98% while ambulating.  He endorsed a lot of pain in his left ribs while walking.

## 2016-07-19 ENCOUNTER — Emergency Department (HOSPITAL_COMMUNITY)
Admission: EM | Admit: 2016-07-19 | Discharge: 2016-07-19 | Disposition: A | Discharge status: Home / Self Care | Payer: Self-pay | Attending: Emergency Medicine | Admitting: Emergency Medicine

## 2016-07-19 DIAGNOSIS — Z5321 Procedure and treatment not carried out due to patient leaving prior to being seen by health care provider: Secondary | ICD-10-CM | POA: Insufficient documentation

## 2016-07-19 DIAGNOSIS — R1032 Left lower quadrant pain: Secondary | ICD-10-CM | POA: Insufficient documentation

## 2016-07-19 LAB — CBC
HCT: 45.4 % (ref 39.0–52.0)
HEMOGLOBIN: 15.6 g/dL (ref 13.0–17.0)
MCH: 31.6 pg (ref 26.0–34.0)
MCHC: 34.4 g/dL (ref 30.0–36.0)
MCV: 92.1 fL (ref 78.0–100.0)
Platelets: 195 10*3/uL (ref 150–400)
RBC: 4.93 MIL/uL (ref 4.22–5.81)
RDW: 14 % (ref 11.5–15.5)
WBC: 7.1 10*3/uL (ref 4.0–10.5)

## 2016-07-19 LAB — URINALYSIS, ROUTINE W REFLEX MICROSCOPIC
BILIRUBIN URINE: NEGATIVE
Glucose, UA: NEGATIVE mg/dL
Hgb urine dipstick: NEGATIVE
KETONES UR: NEGATIVE mg/dL
Leukocytes, UA: NEGATIVE
NITRITE: NEGATIVE
Protein, ur: NEGATIVE mg/dL
Specific Gravity, Urine: 1.002 — ABNORMAL LOW (ref 1.005–1.030)
pH: 5 (ref 5.0–8.0)

## 2016-07-19 LAB — COMPREHENSIVE METABOLIC PANEL
ALBUMIN: 4 g/dL (ref 3.5–5.0)
ALK PHOS: 62 U/L (ref 38–126)
ALT: 31 U/L (ref 17–63)
ANION GAP: 12 (ref 5–15)
AST: 64 U/L — ABNORMAL HIGH (ref 15–41)
BILIRUBIN TOTAL: 0.9 mg/dL (ref 0.3–1.2)
BUN: 13 mg/dL (ref 6–20)
CALCIUM: 9.1 mg/dL (ref 8.9–10.3)
CO2: 21 mmol/L — ABNORMAL LOW (ref 22–32)
Chloride: 103 mmol/L (ref 101–111)
Creatinine, Ser: 0.92 mg/dL (ref 0.61–1.24)
GFR calc Af Amer: >60 mL/min (ref >60)
GLUCOSE: 77 mg/dL (ref 65–99)
Potassium: 3.8 mmol/L (ref 3.5–5.1)
Sodium: 136 mmol/L (ref 135–145)
TOTAL PROTEIN: 7.5 g/dL (ref 6.5–8.1)

## 2016-07-19 LAB — LIPASE, BLOOD: Lipase: 37 U/L (ref 11–51)

## 2016-07-19 NOTE — ED Notes (Signed)
Pt called with no response x2 

## 2016-07-19 NOTE — ED Notes (Signed)
Pt called for room with no response.  °

## 2016-07-19 NOTE — ED Notes (Signed)
Pt now reports he had a tb skin test at a clinic on [address] and was diagnosed with TB, states he was not given any medications to treat it because he has medicaid.

## 2016-07-19 NOTE — ED Triage Notes (Signed)
Pt arrives via [facility] for c/o LLQ pain and urinating blood, hx of cirrhosis and alcohol abuse. A/ox4.

## 2016-12-30 ENCOUNTER — Ambulatory Visit: Payer: Self-pay | Admitting: Internal Medicine

## 2017-01-28 ENCOUNTER — Encounter: Payer: Self-pay | Admitting: Internal Medicine

## 2017-01-28 ENCOUNTER — Ambulatory Visit: Payer: Self-pay | Admitting: Internal Medicine

## 2017-01-28 VITALS — BP 122/88 | HR 86 | Resp 14 | Ht 69.0 in | Wt 160.0 lb

## 2017-01-28 DIAGNOSIS — Z23 Encounter for immunization: Secondary | ICD-10-CM

## 2017-01-28 DIAGNOSIS — F1020 Alcohol dependence, uncomplicated: Secondary | ICD-10-CM

## 2017-01-28 DIAGNOSIS — R748 Abnormal levels of other serum enzymes: Secondary | ICD-10-CM

## 2017-01-28 DIAGNOSIS — M25552 Pain in left hip: Secondary | ICD-10-CM

## 2017-01-28 DIAGNOSIS — B1920 Unspecified viral hepatitis C without hepatic coma: Secondary | ICD-10-CM

## 2017-01-28 DIAGNOSIS — K219 Gastro-esophageal reflux disease without esophagitis: Secondary | ICD-10-CM

## 2017-01-28 DIAGNOSIS — M79642 Pain in left hand: Secondary | ICD-10-CM

## 2017-01-28 DIAGNOSIS — J411 Mucopurulent chronic bronchitis: Secondary | ICD-10-CM

## 2017-01-28 MED ORDER — ALBUTEROL SULFATE HFA 108 (90 BASE) MCG/ACT IN AERS: 2.0000 | INHALATION_SPRAY | Freq: Four times a day (QID) | RESPIRATORY_TRACT | 2 refills | Status: DC | PRN

## 2017-01-28 MED ORDER — MELOXICAM 7.5 MG PO TABS: ORAL_TABLET | ORAL | 2 refills | Status: DC

## 2017-01-28 MED ORDER — TIOTROPIUM BROMIDE MONOHYDRATE 18 MCG IN CAPS: 18.0000 ug | ORAL_CAPSULE | Freq: Every day | RESPIRATORY_TRACT | 12 refills | Status: DC

## 2017-01-28 MED ORDER — FAMOTIDINE 20 MG PO TABS: ORAL_TABLET | ORAL | 4 refills | Status: DC

## 2017-01-28 NOTE — Progress Notes (Signed)
LCSW completed new patient screening with pt. Pt reported fatigue, reduced appetite, trouble concentrating, worries, and restlessness. He shared that he has a lot of stress in his life due to caring for his wife. He shared that he becomes tearful at times and doesn't understand why. Daughter accompanied pt and she shared that they have a lot of relationship problems. Pt and daughter both became extremely tearful and requested counseling to work on their relationship issues. Scheduled for 11/12.
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
## 2017-01-28 NOTE — Patient Instructions (Signed)
Elevate Head of bed.

## 2017-01-29 LAB — CBC WITH DIFFERENTIAL/PLATELET
BASOS ABS: 0 10*3/uL (ref 0.0–0.2)
Basos: 0 %
EOS (ABSOLUTE): 0 10*3/uL (ref 0.0–0.4)
Eos: 0 %
Hematocrit: 47.3 % (ref 37.5–51.0)
Hemoglobin: 15.5 g/dL (ref 13.0–17.7)
IMMATURE GRANS (ABS): 0 10*3/uL (ref 0.0–0.1)
IMMATURE GRANULOCYTES: 0 %
LYMPHS: 48 %
Lymphocytes Absolute: 2.3 10*3/uL (ref 0.7–3.1)
MCH: 32.5 pg (ref 26.6–33.0)
MCHC: 32.8 g/dL (ref 31.5–35.7)
MCV: 99 fL — ABNORMAL HIGH (ref 79–97)
Monocytes Absolute: 0.5 10*3/uL (ref 0.1–0.9)
Monocytes: 10 %
NEUTROS PCT: 42 %
Neutrophils Absolute: 2 10*3/uL (ref 1.4–7.0)
PLATELETS: 206 10*3/uL (ref 150–379)
RBC: 4.77 x10E6/uL (ref 4.14–5.80)
RDW: 15.4 % (ref 12.3–15.4)
WBC: 4.8 10*3/uL (ref 3.4–10.8)

## 2017-01-29 LAB — COMPREHENSIVE METABOLIC PANEL
ALT: 51 IU/L — AB (ref 0–44)
AST: 102 IU/L — ABNORMAL HIGH (ref 0–40)
Albumin/Globulin Ratio: 1.4 (ref 1.2–2.2)
Albumin: 4.3 g/dL (ref 3.5–5.5)
Alkaline Phosphatase: 65 IU/L (ref 39–117)
BUN/Creatinine Ratio: 6 — ABNORMAL LOW (ref 9–20)
BUN: 6 mg/dL (ref 6–24)
Bilirubin Total: 0.6 mg/dL (ref 0.0–1.2)
CALCIUM: 9 mg/dL (ref 8.7–10.2)
CO2: 18 mmol/L — ABNORMAL LOW (ref 20–29)
CREATININE: 0.94 mg/dL (ref 0.76–1.27)
Chloride: 102 mmol/L (ref 96–106)
GFR, EST AFRICAN AMERICAN: 104 mL/min/{1.73_m2} (ref >59)
GFR, EST NON AFRICAN AMERICAN: 90 mL/min/{1.73_m2} (ref >59)
GLUCOSE: 77 mg/dL (ref 65–99)
Globulin, Total: 3.1 g/dL (ref 1.5–4.5)
Potassium: 5 mmol/L (ref 3.5–5.2)
Sodium: 139 mmol/L (ref 134–144)
Total Protein: 7.4 g/dL (ref 6.0–8.5)

## 2017-01-29 LAB — HEPATITIS C ANTIBODY: Hep C Virus Ab: >11 s/co ratio — ABNORMAL HIGH (ref 0.0–0.9)

## 2017-02-03 ENCOUNTER — Other Ambulatory Visit: Payer: Self-pay | Admitting: Licensed Clinical Social Worker

## 2017-02-11 ENCOUNTER — Other Ambulatory Visit: Payer: Self-pay | Admitting: Licensed Clinical Social Worker

## 2017-02-20 LAB — HEPATITIS B SURFACE ANTIGEN: Hepatitis B Surface Ag: NEGATIVE

## 2017-02-20 LAB — SPECIMEN STATUS REPORT

## 2017-02-20 LAB — HEPATITIS B CORE ANTIBODY, TOTAL: Hep B Core Total Ab: POSITIVE — AB

## 2017-02-20 LAB — HEPATITIS A ANTIBODY, TOTAL: HEP A TOTAL AB: NEGATIVE

## 2017-02-20 LAB — HEPATITIS B SURFACE ANTIBODY,QUALITATIVE: HEP B SURFACE AB, QUAL: REACTIVE

## 2017-03-06 NOTE — Progress Notes (Signed)
Referral, OV notes and demographics faxed to [facility]. Facility will contact patient to schedule appointment. 

## 2017-04-01 ENCOUNTER — Ambulatory Visit: Payer: Self-pay | Admitting: Internal Medicine

## 2017-04-29 ENCOUNTER — Ambulatory Visit: Payer: Self-pay | Admitting: Internal Medicine

## 2017-05-27 ENCOUNTER — Ambulatory Visit: Payer: Self-pay | Admitting: Internal Medicine

## 2017-07-07 ENCOUNTER — Ambulatory Visit: Payer: Self-pay | Admitting: Internal Medicine

## 2017-08-01 ENCOUNTER — Ambulatory Visit: Payer: Self-pay | Admitting: Internal Medicine

## 2017-10-27 ENCOUNTER — Emergency Department (HOSPITAL_COMMUNITY): Payer: Medicaid Other

## 2017-10-27 ENCOUNTER — Other Ambulatory Visit: Payer: Self-pay

## 2017-10-27 ENCOUNTER — Emergency Department (HOSPITAL_COMMUNITY)
Admission: EM | Admit: 2017-10-27 | Discharge: 2017-10-27 | Disposition: A | Discharge status: Home / Self Care | Payer: Medicaid Other | Attending: Emergency Medicine | Admitting: Emergency Medicine

## 2017-10-27 ENCOUNTER — Encounter (HOSPITAL_COMMUNITY): Payer: Self-pay | Admitting: Emergency Medicine

## 2017-10-27 DIAGNOSIS — I10 Essential (primary) hypertension: Secondary | ICD-10-CM | POA: Diagnosis not present

## 2017-10-27 DIAGNOSIS — F17210 Nicotine dependence, cigarettes, uncomplicated: Secondary | ICD-10-CM | POA: Diagnosis not present

## 2017-10-27 DIAGNOSIS — G629 Polyneuropathy, unspecified: Secondary | ICD-10-CM

## 2017-10-27 DIAGNOSIS — J189 Pneumonia, unspecified organism: Secondary | ICD-10-CM | POA: Diagnosis not present

## 2017-10-27 DIAGNOSIS — G9009 Other idiopathic peripheral autonomic neuropathy: Secondary | ICD-10-CM | POA: Insufficient documentation

## 2017-10-27 DIAGNOSIS — M25552 Pain in left hip: Secondary | ICD-10-CM | POA: Diagnosis present

## 2017-10-27 DIAGNOSIS — Z79899 Other long term (current) drug therapy: Secondary | ICD-10-CM | POA: Diagnosis not present

## 2017-10-27 LAB — URINALYSIS, ROUTINE W REFLEX MICROSCOPIC
Bilirubin Urine: NEGATIVE
Glucose, UA: NEGATIVE mg/dL
Hgb urine dipstick: NEGATIVE
Ketones, ur: NEGATIVE mg/dL
Leukocytes, UA: NEGATIVE
Nitrite: NEGATIVE
Protein, ur: NEGATIVE mg/dL
Specific Gravity, Urine: 1.01 (ref 1.005–1.030)
pH: 5 (ref 5.0–8.0)

## 2017-10-27 MED ORDER — DOXYCYCLINE HYCLATE 100 MG PO CAPS
100.0000 mg | ORAL_CAPSULE | Freq: Two times a day (BID) | ORAL | 0 refills | Status: DC
Start: 2017-10-27 — End: 2017-11-11

## 2017-10-27 MED ORDER — IBUPROFEN 200 MG PO TABS
600.0000 mg | ORAL_TABLET | Freq: Once | ORAL | Status: AC
  Administered 2017-10-27: 13:00:00 600 mg via ORAL
  Filled 2017-10-27: qty 1

## 2017-10-27 MED ORDER — IBUPROFEN 600 MG PO TABS: 600.0000 mg | ORAL_TABLET | Freq: Four times a day (QID) | ORAL | 0 refills | Status: DC | PRN

## 2017-10-27 NOTE — Discharge Instructions (Addendum)
Please read attached information. If you experience any new or worsening signs or symptoms please return to the emergency room for evaluation. Please follow-up with your primary care provider or specialist as discussed. Please use medication prescribed only as directed and discontinue taking if you have any concerning signs or symptoms.   °

## 2017-10-27 NOTE — ED Notes (Signed)
Contacted ortho tech.

## 2017-10-27 NOTE — ED Notes (Signed)
Pt shoe size 9.5

## 2017-10-27 NOTE — ED Provider Notes (Signed)
[facility] EMERGENCY DEPARTMENT Provider Note   CSN: [id number] Arrival date & time: 10/27/17  1018   History   Chief Complaint Chief Complaint  Patient presents with  . Hip Pain  . Foot Pain    HPI Patrick Estrada is a 57 y.o. male.  HPI   57 year old male with a past medical history of arthritis, hypertension, hep C, alcoholism presents today with several complaints. She has approximately 2 months ago he suffered an injury to his right foot while mowing his lawn. He notes since that time he has had no sensation from his distal tibia throughout his entire foot. He notes he is unable to move in any direction the foot ankle or toes. She denies any injury to his knee or upper leg.   Patient also notes for several months he's had pain in his left posterior hip, sharp in nature. He denies injury to the area, worse with movement. He notes Aleve helps his symptoms for a short period of time but they return.  Patient also notes for several years patient has had morning coughing, nausea and vomiting. He notes the symptoms go away after the episode of vomiting, no associated abdominal pain or fever.  Patient also notes some initial burning with urination for unspecified amount of time.   Patient notes he is a smoker, also regularly drinks alcohol.     Past Medical History:  Diagnosis Date  . Arthritis   . Asthma   . Hypertension 10/12/2011    Patient Active Problem List   Diagnosis Date Noted  . Alcohol dependence (HCC) 12/12/2012  . Alcohol withdrawal (HCC) 12/12/2012  . Fall from ladder 10/14/2011  . 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 Alcohol use 10/14/2011  . Abdominal wall contusion 10/14/2011    Past Surgical History:  Procedure Laterality Date  . HERNIA REPAIR Left Teens  . Right middle finger repair Right         Home Medications    Prior to Admission medications   Medication Sig Start Date End Date Taking? Authorizing Provider    albuterol (PROVENTIL HFA;VENTOLIN HFA) 108 (90 Base) MCG/ACT inhaler Inhale 2 puffs every 6 (six) hours as needed into the lungs for wheezing or shortness of breath. 01/28/17   Julieanne Manson, MD  doxycycline (VIBRAMYCIN) 100 MG capsule Take 1 capsule (100 mg total) by mouth 2 (two) times daily. 10/27/17   Brean Carberry, Tinnie Gens, PA-C  famotidine (PEPCID) 20 MG tablet 2 tabs by mouth at bedtime Patient not taking: Reported on 10/27/2017 01/28/17   Julieanne Manson, MD  ibuprofen (ADVIL,MOTRIN) 600 MG tablet Take 1 tablet (600 mg total) by mouth every 6 (six) hours as needed. 10/27/17   Rene Gonsoulin, Tinnie Gens, PA-C  meloxicam (MOBIC) 7.5 MG tablet 1 tab by mouth daily with food Patient not taking: Reported on 10/27/2017 01/28/17   Julieanne Manson, MD  tiotropium (SPIRIVA HANDIHALER) 18 MCG inhalation capsule Place 1 capsule (18 mcg total) daily into inhaler and inhale. 01/28/17   Julieanne Manson, MD    Family History Family History  Problem Relation Age of Onset  . Diabetes Mother   . Heart disease Mother        CHF  . Alcohol abuse Father   . Diabetes Sister   . Hypertension Sister   . Alcohol abuse Brother   . Drug abuse Son        suffered cardiac arrest playing bball after smoking "synthetic weed"  . Arthritis Sister   . Alcohol abuse  Brother   . Alcohol abuse Brother   . Premature birth Son        not sure of cause of death    Social History Social History   Tobacco Use  . Smoking status: Current Every Day Smoker    Packs/day: 0.50    Years: 42.00    Pack years: 21.00    Types: Cigarettes  . Smokeless tobacco: Never Used  . Tobacco comment: To work on this after he focuses on alcohol  Substance Use Topics  . Alcohol use: Yes    Comment: Drinks to excess daily:  beer, wine, liquor  . Drug use: Yes    Types: "Crack" cocaine    Comment: No use for years.  Went through rehab     Allergies   Morphine and related   Review of Systems Review of Systems  All other systems  reviewed and are negative.    Physical Exam Updated Vital Signs BP (!) 142/100 (BP Location: Right Arm)   Pulse 86   Temp 100 F (37.8 C) (Oral)   Resp 18   Ht 5\' 9"  (1.753 m)   Wt 83.9 kg (185 lb)   SpO2 99%   BMI 27.32 kg/m    Physical Exam  Pulmonary/Chest: Effort normal and breath sounds normal. No stridor. No respiratory distress. He has no wheezes. He has no rales. He exhibits no tenderness.  Abdominal: Soft. He exhibits no distension and no mass. There is no tenderness. There is no rebound and no guarding. No hernia.  Musculoskeletal:  No midline tenderness palpation, exquisite tenderness palpation of the left posterior hip and lumbar musculature  Right lower extremity symmetrical to the left with small amount of atrophy to right calf, no tenderness to palpation of the knee or fibular head- complete loss of sensation from the distal tibia throughout the entire foot- patient unable to move any of the distal extremity- pupils 2+ cap refill intact, no redness swelling or warmth     ED Treatments / Results  Labs (all labs ordered are listed, but only abnormal results are displayed) Labs Reviewed  URINALYSIS, ROUTINE W REFLEX MICROSCOPIC    EKG None  Radiology Dg Chest 2 View  Result Date: 10/27/2017 CLINICAL DATA:  Productive cough with SOB x 2 years, HTN, smoker - 1 pack per week x age 26, pt shielded EXAM: CHEST - 2 VIEW COMPARISON:  Chest x-ray dated 06/20/2016. FINDINGS: Heart size and mediastinal contours are within normal limits. Small LEFT pleural effusion. Lungs are otherwise clear. No acute or suspicious osseous finding. Old fracture of the LEFT eleventh rib. IMPRESSION: 1. Small LEFT pleural effusion. Lungs otherwise clear. Cannot exclude underlying pneumonia. 2. 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 Electronically Signed   By: Bary Richard M.D.   On: 10/27/2017 13:25   Dg Ankle 2 Views Right  Result Date: 10/27/2017 CLINICAL  DATA:  RIGHT foot/ankle injury, pain for 2 months. EXAM: RIGHT ANKLE - 2 VIEW COMPARISON:  None. FINDINGS: Osseous alignment is normal. Ankle mortise is symmetric. No fracture line or displaced fracture fragment. No acute or suspicious osseous lesion. No degenerative change at the LEFT ankle. Visualized portions of the hindfoot and midfoot appear intact and normally aligned. Vascular calcifications. Soft tissues about the RIGHT ankle are otherwise unremarkable. IMPRESSION: No acute findings.  Vascular calcifications. Electronically Signed   By: Bary Richard M.D.   On: 10/27/2017 13:27   Dg Foot Complete Right  Result Date: 10/27/2017  CLINICAL DATA:  RIGHT ankle/foot pain for 2 months, status post injury. EXAM: RIGHT FOOT COMPLETE - 3+ VIEW COMPARISON:  None. FINDINGS: There is no evidence of fracture or dislocation. There is no evidence of arthropathy or other focal bone abnormality. Soft tissues are unremarkable. IMPRESSION: Negative. Electronically Signed   By: Bary RichardStan  Maynard M.D.   On: 10/27/2017 13:27   Dg Hip Unilat W Or Wo Pelvis 2-3 Views Left  Result Date: 10/27/2017 CLINICAL DATA:  Left anterior hip pain now has moved to posterior hip x 2 months, no known injury, HTN, smoker - 1 pack per week x age 57, pt shielded EXAM: DG HIP (WITH OR WITHOUT PELVIS) 2-3V LEFT COMPARISON:  None. FINDINGS: There is no evidence of hip fracture or dislocation. There is no evidence of arthropathy or other focal bone abnormality. IMPRESSION: Negative. Electronically Signed   By: Bary RichardStan  Maynard M.D.   On: 10/27/2017 13:25    Procedures Procedures (including critical care time)  Medications Ordered in ED Medications  ibuprofen (ADVIL,MOTRIN) tablet 600 mg (600 mg Oral Given 10/27/17 1248)     Initial Impression / Assessment and Plan / ED Course  I have reviewed the triage vital signs and the nursing notes.  Pertinent labs & imaging results that were available during my care of the patient were reviewed by me and  considered in my medical decision making (see chart for details).     Labs: Urinalysis  Imaging: DG chest, DG hip, DG foot, DG ankle  Consults:  Therapeutics:  Discharge Meds:  Doxycycline, ibuprofen   Assessment/Plan: 57 year old male presents today with several complaints. Patient with decreased sensation to his foot and ankle, this is diffuse with no movement of the foot, uncertain pathology at this time given the dermal pattern, no low back pain, no other neurological deficits. Patient be referred to orthopedics for ongoing evaluation for this. Patient also having cough with chest x-ray showing pleural effusion, given his duration of symptoms he'll be started on antibiotics, I have encouraged him to follow up after completion of antibiotics for repeat chest x-ray to ensure resolution and no underlying malignancy. Patient given strict return precautions outpatient follow-up  Final Clinical Impressions(s) / ED Diagnoses   Final diagnoses:  Neuropathy  Community acquired pneumonia of left lung, unspecified part of lung    ED Discharge Orders        Ordered    doxycycline (VIBRAMYCIN) 100 MG capsule  2 times daily     10/27/17 1418    ibuprofen (ADVIL,MOTRIN) 600 MG tablet  Every 6 hours PRN     10/27/17 1418       Eyvonne MechanicHedges, Vidya Bamford, PA-C 10/27/17 1420    Sabas SousBero, Michael M, MD 10/27/17 2304

## 2017-10-27 NOTE — ED Triage Notes (Signed)
Pt. Stated, My right foot pain started 2 months ago and my left hip has been hurting for a year. I need xrays for all of it.

## 2017-10-27 NOTE — ED Notes (Signed)
Asked pt for urine specimen. Pt unable to go at this time.

## 2017-11-11 ENCOUNTER — Encounter (HOSPITAL_COMMUNITY): Payer: Self-pay | Admitting: Internal Medicine

## 2017-11-11 ENCOUNTER — Observation Stay (HOSPITAL_COMMUNITY): Payer: Medicaid Other

## 2017-11-11 ENCOUNTER — Other Ambulatory Visit: Payer: Self-pay

## 2017-11-11 ENCOUNTER — Emergency Department (HOSPITAL_COMMUNITY): Payer: Medicaid Other

## 2017-11-11 ENCOUNTER — Inpatient Hospital Stay (HOSPITAL_COMMUNITY)
Admission: EM | Admit: 2017-11-11 | Discharge: 2017-11-14 | DRG: 193 | Disposition: A | Discharge status: Home / Self Care | Payer: Medicaid Other | Attending: Internal Medicine | Admitting: Internal Medicine

## 2017-11-11 DIAGNOSIS — J9601 Acute respiratory failure with hypoxia: Secondary | ICD-10-CM | POA: Diagnosis present

## 2017-11-11 DIAGNOSIS — J189 Pneumonia, unspecified organism: Principal | ICD-10-CM | POA: Diagnosis present

## 2017-11-11 DIAGNOSIS — Z8249 Family history of ischemic heart disease and other diseases of the circulatory system: Secondary | ICD-10-CM

## 2017-11-11 DIAGNOSIS — Z833 Family history of diabetes mellitus: Secondary | ICD-10-CM

## 2017-11-11 DIAGNOSIS — F17200 Nicotine dependence, unspecified, uncomplicated: Secondary | ICD-10-CM | POA: Diagnosis present

## 2017-11-11 DIAGNOSIS — R0602 Shortness of breath: Secondary | ICD-10-CM

## 2017-11-11 DIAGNOSIS — J918 Pleural effusion in other conditions classified elsewhere: Secondary | ICD-10-CM | POA: Diagnosis present

## 2017-11-11 DIAGNOSIS — I10 Essential (primary) hypertension: Secondary | ICD-10-CM | POA: Diagnosis present

## 2017-11-11 DIAGNOSIS — I5032 Chronic diastolic (congestive) heart failure: Secondary | ICD-10-CM | POA: Diagnosis present

## 2017-11-11 DIAGNOSIS — Z8261 Family history of arthritis: Secondary | ICD-10-CM

## 2017-11-11 DIAGNOSIS — I110 Hypertensive heart disease with heart failure: Secondary | ICD-10-CM | POA: Diagnosis present

## 2017-11-11 DIAGNOSIS — J411 Mucopurulent chronic bronchitis: Secondary | ICD-10-CM

## 2017-11-11 DIAGNOSIS — J90 Pleural effusion, not elsewhere classified: Secondary | ICD-10-CM | POA: Diagnosis present

## 2017-11-11 DIAGNOSIS — F1020 Alcohol dependence, uncomplicated: Secondary | ICD-10-CM | POA: Diagnosis present

## 2017-11-11 DIAGNOSIS — F17210 Nicotine dependence, cigarettes, uncomplicated: Secondary | ICD-10-CM | POA: Diagnosis present

## 2017-11-11 LAB — ETHANOL

## 2017-11-11 LAB — BASIC METABOLIC PANEL
ANION GAP: 11 (ref 5–15)
BUN: 9 mg/dL (ref 6–20)
CHLORIDE: 102 mmol/L (ref 98–111)
CO2: 23 mmol/L (ref 22–32)
Calcium: 8.8 mg/dL — ABNORMAL LOW (ref 8.9–10.3)
Creatinine, Ser: 0.88 mg/dL (ref 0.61–1.24)
Glucose, Bld: 87 mg/dL (ref 70–99)
POTASSIUM: 4.2 mmol/L (ref 3.5–5.1)
SODIUM: 136 mmol/L (ref 135–145)

## 2017-11-11 LAB — GLUCOSE, PLEURAL OR PERITONEAL FLUID: Glucose, Fluid: 85 mg/dL

## 2017-11-11 LAB — CBC WITH DIFFERENTIAL/PLATELET
Abs Immature Granulocytes: 0 10*3/uL (ref 0.0–0.1)
BASOS ABS: 0.1 10*3/uL (ref 0.0–0.1)
BASOS PCT: 1 %
Eosinophils Absolute: 0 10*3/uL (ref 0.0–0.7)
Eosinophils Relative: 0 %
HCT: 44.2 % (ref 39.0–52.0)
HEMOGLOBIN: 15 g/dL (ref 13.0–17.0)
IMMATURE GRANULOCYTES: 0 %
LYMPHS PCT: 19 %
Lymphs Abs: 1.3 10*3/uL (ref 0.7–4.0)
MCH: 31.6 pg (ref 26.0–34.0)
MCHC: 33.9 g/dL (ref 30.0–36.0)
MCV: 93.1 fL (ref 78.0–100.0)
Monocytes Absolute: 1 10*3/uL (ref 0.1–1.0)
Monocytes Relative: 15 %
NEUTROS PCT: 65 %
Neutro Abs: 4.1 10*3/uL (ref 1.7–7.7)
PLATELETS: 365 10*3/uL (ref 150–400)
RBC: 4.75 MIL/uL (ref 4.22–5.81)
RDW: 13.3 % (ref 11.5–15.5)
WBC: 6.4 10*3/uL (ref 4.0–10.5)

## 2017-11-11 LAB — BODY FLUID CELL COUNT WITH DIFFERENTIAL
Lymphs, Fluid: 81 %
Monocyte-Macrophage-Serous Fluid: 11 % — ABNORMAL LOW (ref 50–90)
Neutrophil Count, Fluid: 8 % (ref 0–25)
Total Nucleated Cell Count, Fluid: 2475 cu mm — ABNORMAL HIGH (ref 0–1000)

## 2017-11-11 LAB — LACTATE DEHYDROGENASE, PLEURAL OR PERITONEAL FLUID: LD FL: 307 U/L — AB (ref 3–23)

## 2017-11-11 LAB — RAPID URINE DRUG SCREEN, HOSP PERFORMED
Amphetamines: NOT DETECTED
BARBITURATES: NOT DETECTED
BENZODIAZEPINES: NOT DETECTED
COCAINE: NOT DETECTED
OPIATES: NOT DETECTED
Tetrahydrocannabinol: POSITIVE — AB

## 2017-11-11 LAB — LACTATE DEHYDROGENASE: LDH: 175 U/L (ref 98–192)

## 2017-11-11 LAB — HIV ANTIBODY (ROUTINE TESTING W REFLEX): HIV SCREEN 4TH GENERATION: NONREACTIVE

## 2017-11-11 LAB — GRAM STAIN

## 2017-11-11 LAB — I-STAT TROPONIN, ED: TROPONIN I, POC: 0.01 ng/mL (ref 0.00–0.08)

## 2017-11-11 LAB — PROTEIN, PLEURAL OR PERITONEAL FLUID: TOTAL PROTEIN, FLUID: 4.8 g/dL

## 2017-11-11 LAB — BRAIN NATRIURETIC PEPTIDE: B Natriuretic Peptide: 21.6 pg/mL (ref 0.0–100.0)

## 2017-11-11 LAB — PROCALCITONIN: Procalcitonin: <0.1 ng/mL

## 2017-11-11 MED ORDER — FOLIC ACID 1 MG PO TABS
1.0000 mg | ORAL_TABLET | Freq: Every day | ORAL | Status: DC
  Administered 2017-11-12 – 2017-11-14 (×3): 1 mg via ORAL
  Filled 2017-11-11 (×3): qty 1

## 2017-11-11 MED ORDER — PIPERACILLIN-TAZOBACTAM 3.375 G IVPB
3.3750 g | Freq: Three times a day (TID) | INTRAVENOUS | Status: DC
  Administered 2017-11-11 – 2017-11-14 (×8): 3.375 g via INTRAVENOUS
  Filled 2017-11-11 (×9): qty 50

## 2017-11-11 MED ORDER — ONDANSETRON 4 MG PO TBDP
4.0000 mg | ORAL_TABLET | Freq: Once | ORAL | Status: AC
  Administered 2017-11-11: 4 mg via ORAL
  Filled 2017-11-11: qty 1

## 2017-11-11 MED ORDER — ADULT MULTIVITAMIN W/MINERALS CH
1.0000 | ORAL_TABLET | Freq: Every day | ORAL | Status: DC
  Administered 2017-11-12 – 2017-11-14 (×3): 1 via ORAL
  Filled 2017-11-11 (×3): qty 1

## 2017-11-11 MED ORDER — LORAZEPAM 1 MG PO TABS: 1.0000 mg | ORAL_TABLET | Freq: Four times a day (QID) | ORAL | Status: AC | PRN

## 2017-11-11 MED ORDER — ACETAMINOPHEN 325 MG PO TABS: 650.0000 mg | ORAL_TABLET | Freq: Four times a day (QID) | ORAL | Status: DC | PRN

## 2017-11-11 MED ORDER — ALBUTEROL SULFATE (2.5 MG/3ML) 0.083% IN NEBU: 2.5000 mg | INHALATION_SOLUTION | RESPIRATORY_TRACT | Status: DC | PRN

## 2017-11-11 MED ORDER — ONDANSETRON HCL 4 MG PO TABS: 4.0000 mg | ORAL_TABLET | Freq: Four times a day (QID) | ORAL | Status: DC | PRN

## 2017-11-11 MED ORDER — IBUPROFEN 600 MG PO TABS
600.0000 mg | ORAL_TABLET | Freq: Four times a day (QID) | ORAL | Status: DC | PRN
  Administered 2017-11-12 – 2017-11-13 (×2): 600 mg via ORAL
  Filled 2017-11-11 (×2): qty 1

## 2017-11-11 MED ORDER — LIDOCAINE HCL (PF) 1 % IJ SOLN
INTRAMUSCULAR | Status: AC
  Filled 2017-11-11: qty 10

## 2017-11-11 MED ORDER — THIAMINE HCL 100 MG/ML IJ SOLN
100.0000 mg | Freq: Every day | INTRAMUSCULAR | Status: DC
  Administered 2017-11-12: 100 mg via INTRAVENOUS
  Filled 2017-11-11 (×2): qty 2

## 2017-11-11 MED ORDER — NICOTINE 14 MG/24HR TD PT24
14.0000 mg | MEDICATED_PATCH | Freq: Every day | TRANSDERMAL | Status: DC
  Administered 2017-11-11 – 2017-11-14 (×4): 14 mg via TRANSDERMAL
  Filled 2017-11-11 (×4): qty 1

## 2017-11-11 MED ORDER — LORAZEPAM 2 MG/ML IJ SOLN: 1.0000 mg | Freq: Four times a day (QID) | INTRAMUSCULAR | Status: AC | PRN

## 2017-11-11 MED ORDER — VITAMIN B-1 100 MG PO TABS
100.0000 mg | ORAL_TABLET | Freq: Every day | ORAL | Status: DC
  Administered 2017-11-13 – 2017-11-14 (×2): 100 mg via ORAL
  Filled 2017-11-11 (×3): qty 1

## 2017-11-11 MED ORDER — LACTATED RINGERS IV SOLN
INTRAVENOUS | Status: DC
  Administered 2017-11-11: 09:00:00 via INTRAVENOUS

## 2017-11-11 MED ORDER — HYDRALAZINE HCL 20 MG/ML IJ SOLN: 5.0000 mg | INTRAMUSCULAR | Status: DC | PRN

## 2017-11-11 MED ORDER — ONDANSETRON HCL 4 MG/2ML IJ SOLN: 4.0000 mg | Freq: Four times a day (QID) | INTRAMUSCULAR | Status: DC | PRN

## 2017-11-11 MED ORDER — DOCUSATE SODIUM 100 MG PO CAPS
100.0000 mg | ORAL_CAPSULE | Freq: Two times a day (BID) | ORAL | Status: DC
  Administered 2017-11-11 – 2017-11-13 (×3): 100 mg via ORAL
  Filled 2017-11-11 (×5): qty 1

## 2017-11-11 MED ORDER — AMOXICILLIN-POT CLAVULANATE 875-125 MG PO TABS
1.0000 | ORAL_TABLET | Freq: Once | ORAL | Status: AC
  Administered 2017-11-11: 1 via ORAL
  Filled 2017-11-11: qty 1

## 2017-11-11 MED ORDER — ACETAMINOPHEN 650 MG RE SUPP: 650.0000 mg | Freq: Four times a day (QID) | RECTAL | Status: DC | PRN

## 2017-11-11 MED ORDER — IPRATROPIUM-ALBUTEROL 0.5-2.5 (3) MG/3ML IN SOLN
3.0000 mL | Freq: Once | RESPIRATORY_TRACT | Status: AC
  Administered 2017-11-11: 3 mL via RESPIRATORY_TRACT
  Filled 2017-11-11: qty 3

## 2017-11-11 NOTE — ED Notes (Signed)
Pt ambulated steady gait in hallway without assistance, O2 stayed between 95-100% and pulse between 90 and 111.

## 2017-11-11 NOTE — Progress Notes (Signed)
Pt arrived via stretcher from ER.  Ambulated from stretcher to bathroom to bed.  Gait steady, balance intact.  Alert and oriented x4.  Denies any pain, SOB, N/V.  IV in RFA SL flushed.  Admission assessment completed.  Skin check complete with Austin MilesVeronica RN.  Oriented to room, call light within reach, ordered dinner tray.  Will continue to monitor.

## 2017-11-11 NOTE — ED Notes (Signed)
Attempted to call report - Secretary advised RN will return call.  

## 2017-11-11 NOTE — ED Notes (Signed)
Pt transported to floor via w/c - pt has his cell phone, bottle of med, and shoes.
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
## 2017-11-11 NOTE — ED Notes (Signed)
Lunch tray ordered for pt. Orange juice given as requested.

## 2017-11-11 NOTE — ED Notes (Signed)
Pt returned from U/S via w/c. Pt alert, oriented.

## 2017-11-11 NOTE — ED Notes (Signed)
Patient transported to X-ray 

## 2017-11-11 NOTE — ED Notes (Signed)
Pt remains in u/s.
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
## 2017-11-11 NOTE — ED Notes (Signed)
Pt arrived to F7 via stretcher - pt ambulated to bathroom and back to room w/o difficulty. Pt noted to be alert, oriented.

## 2017-11-11 NOTE — Progress Notes (Signed)
Pharmacy Antibiotic Note  Patrick Estrada is a 57 y.o. male admitted on 11/11/2017 with parapneumonic effusion.  Pharmacy has been consulted for zosyn dosing.  WBC 6.4, afebrile. Received 1 dose of augmentin in ED.  Underwent thoracentesis on 8/20 that yielded 1.1 L of amber fluid. Scr 0.88 (CrCl ~88 mL/min).   Plan: Zosyn 3.375g IV q8h (4 hour infusion).  Monitor renal fx, clinical pic, cx results, and LOT  Height: 5\' 9"  (175.3 cm) Weight: 147 lb 14.9 oz (67.1 kg) IBW/kg (Calculated) : 70.7  Temp (24hrs), Avg:98.1 F (36.7 C), Min:98 F (36.7 C), Max:98.1 F (36.7 C)  Recent Labs  Lab 11/11/17 0548  WBC 6.4  CREATININE 0.88    Estimated Creatinine Clearance: 87.9 mL/min (by C-G formula based on SCr of 0.88 mg/dL).    Allergies  Allergen Reactions  . Morphine And Related Itching and Rash    burning    Antimicrobials this admission: Augmentin 8/20 x1 Zosyn 8/20 >>   Dose adjustments this admission: N/A  Microbiology results: 8/20 L Pleural Cx: no orgs  Thank you for allowing pharmacy to be a part of this patient's care.  Girard CooterKimberly Perkins, PharmD Clinical Pharmacist  Pager: [phone] Phone: [phone] 11/11/2017 5:51 PM

## 2017-11-11 NOTE — ED Triage Notes (Signed)
Pt diagnosed with pneumonia on 8/5, almost done with doxycycline prescription. CP only with coughing. Endorses coughing, sweating, sob.

## 2017-11-11 NOTE — ED Notes (Signed)
Pt had been sleeping. Woke - orange juice given as requested.

## 2017-11-11 NOTE — Procedures (Addendum)
PROCEDURE SUMMARY:  Successful US guided diagnostic and therapeutic left thoracentesis. Yielded 1.1 liters of amber fluid. Pt tolerated procedure well, however procedure was stopped prior to removal of all fluid due cough. No immediate complications.  Specimen was sent for labs. CXR ordered.  Hoyt KochKacie Sue-Ellen Adeleigh Barletta PA-C 11/11/2017 1:10 PM

## 2017-11-11 NOTE — ED Notes (Signed)
Pt being transported to U/S via w/c.

## 2017-11-12 ENCOUNTER — Observation Stay (HOSPITAL_BASED_OUTPATIENT_CLINIC_OR_DEPARTMENT_OTHER): Payer: Medicaid Other

## 2017-11-12 DIAGNOSIS — I110 Hypertensive heart disease with heart failure: Secondary | ICD-10-CM | POA: Diagnosis present

## 2017-11-12 DIAGNOSIS — R0600 Dyspnea, unspecified: Secondary | ICD-10-CM

## 2017-11-12 DIAGNOSIS — Z8249 Family history of ischemic heart disease and other diseases of the circulatory system: Secondary | ICD-10-CM | POA: Diagnosis not present

## 2017-11-12 DIAGNOSIS — Z8261 Family history of arthritis: Secondary | ICD-10-CM | POA: Diagnosis not present

## 2017-11-12 DIAGNOSIS — J189 Pneumonia, unspecified organism: Secondary | ICD-10-CM | POA: Diagnosis present

## 2017-11-12 DIAGNOSIS — F17210 Nicotine dependence, cigarettes, uncomplicated: Secondary | ICD-10-CM | POA: Diagnosis present

## 2017-11-12 DIAGNOSIS — R0602 Shortness of breath: Secondary | ICD-10-CM | POA: Diagnosis present

## 2017-11-12 DIAGNOSIS — Z833 Family history of diabetes mellitus: Secondary | ICD-10-CM | POA: Diagnosis not present

## 2017-11-12 DIAGNOSIS — J9601 Acute respiratory failure with hypoxia: Secondary | ICD-10-CM | POA: Diagnosis present

## 2017-11-12 DIAGNOSIS — I5032 Chronic diastolic (congestive) heart failure: Secondary | ICD-10-CM | POA: Diagnosis present

## 2017-11-12 DIAGNOSIS — J918 Pleural effusion in other conditions classified elsewhere: Secondary | ICD-10-CM | POA: Diagnosis present

## 2017-11-12 LAB — PH, BODY FLUID: PH, BODY FLUID: 7.6

## 2017-11-12 LAB — HEPATIC FUNCTION PANEL
ALK PHOS: 52 U/L (ref 38–126)
ALT: 14 U/L (ref 0–44)
AST: 24 U/L (ref 15–41)
Albumin: 2.4 g/dL — ABNORMAL LOW (ref 3.5–5.0)
BILIRUBIN INDIRECT: 0.6 mg/dL (ref 0.3–0.9)
BILIRUBIN TOTAL: 0.8 mg/dL (ref 0.3–1.2)
Bilirubin, Direct: 0.2 mg/dL (ref 0.0–0.2)
Total Protein: 6.1 g/dL — ABNORMAL LOW (ref 6.5–8.1)

## 2017-11-12 LAB — CBC
HCT: 44.1 % (ref 39.0–52.0)
HEMOGLOBIN: 15 g/dL (ref 13.0–17.0)
MCH: 31.7 pg (ref 26.0–34.0)
MCHC: 34 g/dL (ref 30.0–36.0)
MCV: 93.2 fL (ref 78.0–100.0)
Platelets: 354 10*3/uL (ref 150–400)
RBC: 4.73 MIL/uL (ref 4.22–5.81)
RDW: 13.1 % (ref 11.5–15.5)
WBC: 6.7 10*3/uL (ref 4.0–10.5)

## 2017-11-12 LAB — BASIC METABOLIC PANEL
ANION GAP: 7 (ref 5–15)
BUN: 7 mg/dL (ref 6–20)
CHLORIDE: 102 mmol/L (ref 98–111)
CO2: 25 mmol/L (ref 22–32)
Calcium: 8.2 mg/dL — ABNORMAL LOW (ref 8.9–10.3)
Creatinine, Ser: 1.04 mg/dL (ref 0.61–1.24)
GFR calc Af Amer: >60 mL/min (ref >60)
GLUCOSE: 135 mg/dL — AB (ref 70–99)
Potassium: 3.8 mmol/L (ref 3.5–5.1)
Sodium: 134 mmol/L — ABNORMAL LOW (ref 135–145)

## 2017-11-12 LAB — ECHOCARDIOGRAM COMPLETE
Height: 69 in
WEIGHTICAEL: 2366.86 [oz_av]

## 2017-11-12 MED ORDER — ENOXAPARIN SODIUM 40 MG/0.4ML ~~LOC~~ SOLN
40.0000 mg | SUBCUTANEOUS | Status: DC
  Administered 2017-11-12 – 2017-11-13 (×2): 40 mg via SUBCUTANEOUS
  Filled 2017-11-12 (×2): qty 0.4

## 2017-11-12 NOTE — Progress Notes (Signed)
  Echocardiogram 2D Echocardiogram has been performed.  Janalyn HarderWest, Tove Wideman R 11/12/2017, 8:38 AM

## 2017-11-12 NOTE — Progress Notes (Signed)
PROGRESS NOTE  Patrick BowenRoger L Copeman ZOX:[id number] DOB: 01/08/1961 DOA: 11/11/2017 PCP: Julieanne MansonMulberry, Elizabeth, MD  HPI/Recap of past 24 hours: Patrick Estrada is a 57 y.o. male with medical history significant of HTN presenting with worsening SOB and cough for the past few weeks. No sick contacts.  +tobacco, 1 ppd, 42 year history. Pt was seen in the ER on 8/5, CXR showed pleural effusion. Pt was prescribed antibiotics for PNA the last time he was in the ER; he took the antibiotic but it made him sicker so he stopped taking them (n/v). Repeat CXR showed worsening L pleural effusion requiring L sided thoracentesis by IR showing transudative fluid. Pt admitted for further management.   Today, pt reported feeling better, denies any worsening SOB, chest pain, fever/chills, abdominal pain. Still with some chronic cough.   Assessment/Plan: Principal Problem:   Acute respiratory failure with hypoxia (HCC) Active Problems:   Essential hypertension   Alcohol dependence (HCC)   Pleural effusion on left   Tobacco dependence  Acute respiratory failure 2/2 CAP with possible L sided para-pneumonic effusion Improving, not requiring O2 Afebrile with no leukocytosis BC X 2 pending Procalcitonin <0.10 CXR post thoracentesis showed remaining moderate left pleural effusion with consolidation in the left lower lobe and lingula.  No pneumothorax S/P thoracentesis on 11/11/17 yielded 1.1L of amber fluid, procedure stopped due to sig cough. Light's criteria showed exudative pleural effusion Pleural fluid culture pending Continue IV Zosyn  HTN Stable Not on any meds at home IV prn hydralazine  Chronic diastolic HF Stable ECHO showed EF 55%-60%, G1 DD   ETOH dependence Denies any recent heavy alcohol use Observe on CIWA protocol  Tobacco dependence Encourage cessation Nicotine Patch ordered    Code Status: Full  Family Communication: Wife at bedside  Disposition Plan: Once significant  improvement   Consultants:  IR  Procedures:  Thoracentesis on 8/20  Antimicrobials:  Zosyn  DVT prophylaxis: Lovenox   Objective: Vitals:   11/11/17 1652 11/11/17 1700 11/11/17 2356 11/12/17 0731  BP:  (!) 125/91 123/86 138/90  Pulse:  92 82 84  Resp:   17 20  Temp:  97.9 F (36.6 C) 98.9 F (37.2 C) 98.9 F (37.2 C)  TempSrc:  Oral Oral Oral  SpO2:  96% 95% 95%  Weight: 67.1 kg     Height: 5\' 9"  (1.753 m)       Intake/Output Summary (Last 24 hours) at 11/12/2017 1511 Last data filed at 11/11/2017 1637 Gross per 24 hour  Intake 544.97 ml  Output -  Net 544.97 ml   Filed Weights   11/11/17 0641 11/11/17 1652  Weight: 74.8 kg 67.1 kg    Exam:   General: NAD  Cardiovascular: S1, S2 present  Respiratory: Coarse breath sounds bilaterally  Abdomen: Soft, nontender, nondistended, bowel sounds present  Musculoskeletal: No pedal edema bilaterally  Skin: Normal  Psychiatry: Normal mood   Data Reviewed: CBC: Recent Labs  Lab 11/11/17 0548 11/12/17 0234  WBC 6.4 6.7  NEUTROABS 4.1  --   HGB 15.0 15.0  HCT 44.2 44.1  MCV 93.1 93.2  PLT 365 354   Basic Metabolic Panel: Recent Labs  Lab 11/11/17 0548 11/12/17 0234  NA 136 134*  K 4.2 3.8  CL 102 102  CO2 23 25  GLUCOSE 87 135*  BUN 9 7  CREATININE 0.88 1.04  CALCIUM 8.8* 8.2*   GFR: Estimated Creatinine Clearance: 74.4 mL/min (by C-G formula based on SCr of 1.04 mg/dL). Liver Function Tests:  Recent Labs  Lab 11/12/17 0234  AST 24  ALT 14  ALKPHOS 52  BILITOT 0.8  PROT 6.1*  ALBUMIN 2.4*   No results for input(s): LIPASE, AMYLASE in the last 168 hours. No results for input(s): AMMONIA in the last 168 hours. Coagulation Profile: No results for input(s): INR, PROTIME in the last 168 hours. Cardiac Enzymes: No results for input(s): CKTOTAL, CKMB, CKMBINDEX, TROPONINI in the last 168 hours. BNP (last 3 results) No results for input(s): PROBNP in the last 8760 hours. HbA1C: No  results for input(s): HGBA1C in the last 72 hours. CBG: No results for input(s): GLUCAP in the last 168 hours. Lipid Profile: No results for input(s): CHOL, HDL, LDLCALC, TRIG, CHOLHDL, LDLDIRECT in the last 72 hours. Thyroid Function Tests: No results for input(s): TSH, T4TOTAL, FREET4, T3FREE, THYROIDAB in the last 72 hours. Anemia Panel: No results for input(s): VITAMINB12, FOLATE, FERRITIN, TIBC, IRON, RETICCTPCT in the last 72 hours. Urine analysis:    Component Value Date/Time   COLORURINE YELLOW 10/27/2017 1247   APPEARANCEUR CLEAR 10/27/2017 1247   LABSPEC 1.010 10/27/2017 1247   PHURINE 5.0 10/27/2017 1247   GLUCOSEU NEGATIVE 10/27/2017 1247   HGBUR NEGATIVE 10/27/2017 1247   BILIRUBINUR NEGATIVE 10/27/2017 1247   KETONESUR NEGATIVE 10/27/2017 1247   PROTEINUR NEGATIVE 10/27/2017 1247   UROBILINOGEN 0.2 06/02/2014 1506   NITRITE NEGATIVE 10/27/2017 1247   LEUKOCYTESUR NEGATIVE 10/27/2017 1247   Sepsis Labs: @LABRCNTIP (procalcitonin:4,lacticidven:4)  ) Recent Results (from the past 240 hour(s))  Culture, body fluid-bottle     Status: None (Preliminary result)   Collection Time: 11/11/17  1:00 PM  Result Value Ref Range Status   Specimen Description PLEURAL LEFT  Final   Special Requests NONE  Final   Culture   Final    NO GROWTH < 24 HOURS Performed at [facility], [address] [place], [place] [address]    Report Status PENDING  Incomplete  Gram stain     Status: None   Collection Time: 11/11/17  1:00 PM  Result Value Ref Range Status   Specimen Description PLEURAL LEFT  Final   Special Requests NONE  Final   Gram Stain   Final    FEW WBC PRESENT, PREDOMINANTLY MONONUCLEAR NO ORGANISMS SEEN Performed at [facility], [address] [place], [place] [address]    Report Status 11/11/2017 FINAL  Final      Studies: No results found.  Scheduled Meds: . docusate sodium  100 mg Oral BID  . folic acid  1 mg Oral Daily  . multivitamin with  minerals  1 tablet Oral Daily  . nicotine  14 mg Transdermal Daily  . thiamine  100 mg Oral Daily   Or  . thiamine  100 mg Intravenous Daily    Continuous Infusions: . lactated ringers Stopped (11/11/17 1637)  . piperacillin-tazobactam (ZOSYN)  IV 3.375 g (11/12/17 1234)     LOS: 0 days     Briant CedarNkeiruka J Kameran Lallier, MD [facility]   If 7PM-7AM, please contact night-coverage [email or web address] Password [email or web address] 11/12/2017, 3:11 PM

## 2017-11-12 NOTE — Progress Notes (Signed)
RN walked into the room and patient jumped up out of bed and went to the closet. RN asked patient to get back in bed. Patient was polite in doing so. RN noticed that patient had one hand closed and would not open it during his assessment. No history of deficit in either hand. Will continue to monitor closely.

## 2017-11-13 DIAGNOSIS — R0602 Shortness of breath: Secondary | ICD-10-CM

## 2017-11-13 LAB — BASIC METABOLIC PANEL
ANION GAP: 6 (ref 5–15)
BUN: 5 mg/dL — ABNORMAL LOW (ref 6–20)
CALCIUM: 8.4 mg/dL — AB (ref 8.9–10.3)
CO2: 25 mmol/L (ref 22–32)
Chloride: 106 mmol/L (ref 98–111)
Creatinine, Ser: 1.04 mg/dL (ref 0.61–1.24)
Glucose, Bld: 103 mg/dL — ABNORMAL HIGH (ref 70–99)
POTASSIUM: 3.7 mmol/L (ref 3.5–5.1)
Sodium: 137 mmol/L (ref 135–145)

## 2017-11-13 LAB — CBC WITH DIFFERENTIAL/PLATELET
ABS IMMATURE GRANULOCYTES: 0 10*3/uL (ref 0.0–0.1)
BASOS PCT: 0 %
Basophils Absolute: 0 10*3/uL (ref 0.0–0.1)
EOS ABS: 0 10*3/uL (ref 0.0–0.7)
Eosinophils Relative: 1 %
HCT: 45.1 % (ref 39.0–52.0)
Hemoglobin: 15.2 g/dL (ref 13.0–17.0)
IMMATURE GRANULOCYTES: 0 %
Lymphocytes Relative: 28 %
Lymphs Abs: 1.5 10*3/uL (ref 0.7–4.0)
MCH: 31.7 pg (ref 26.0–34.0)
MCHC: 33.7 g/dL (ref 30.0–36.0)
MCV: 94.2 fL (ref 78.0–100.0)
MONOS PCT: 17 %
Monocytes Absolute: 0.9 10*3/uL (ref 0.1–1.0)
NEUTROS ABS: 2.8 10*3/uL (ref 1.7–7.7)
NEUTROS PCT: 54 %
Platelets: 336 10*3/uL (ref 150–400)
RBC: 4.79 MIL/uL (ref 4.22–5.81)
RDW: 13.1 % (ref 11.5–15.5)
WBC: 5.3 10*3/uL (ref 4.0–10.5)

## 2017-11-13 MED ORDER — IPRATROPIUM-ALBUTEROL 0.5-2.5 (3) MG/3ML IN SOLN
3.0000 mL | Freq: Three times a day (TID) | RESPIRATORY_TRACT | Status: DC
  Administered 2017-11-13: 3 mL via RESPIRATORY_TRACT
  Filled 2017-11-13: qty 3

## 2017-11-13 MED ORDER — IPRATROPIUM-ALBUTEROL 0.5-2.5 (3) MG/3ML IN SOLN
3.0000 mL | Freq: Two times a day (BID) | RESPIRATORY_TRACT | Status: DC
  Administered 2017-11-14: 3 mL via RESPIRATORY_TRACT
  Filled 2017-11-13: qty 3

## 2017-11-13 NOTE — Care Management Note (Addendum)
Case Management Note  Patient Details  Name: Patrick Estrada MRN: [id number] Date of Birth: 01/18/1961  Subjective/Objective:  From home with girlfriend, pta indep, he has no insurance or pcp per patient, presents with acute resp failure with hypoxia, htn, etoh dependence, pl effusion on left, tobacco dependence.  NCM [facility]/24 at 2:30 , and will be on waiting list if earlier apt becomes available.  He can use the [facility] for medication ast Mon thru Friday, if he is dc over the weekend he will need a Match letter, he states he has not use this before so he would be eligible.     8/23 Patrick Capeeborah Elysha Daw RN, BSN - NCM gave patient the brochure for [facility] and [facility], informed him to go to [facility] at dc to get his medications.                 Action/Plan: DC home when ready.  Expected Discharge Date:                  Expected Discharge Plan:  Home/Self Care  In-House Referral:     Discharge planning Services  CM Consult, Medication Assistance, Follow-up appt scheduled, [facility]  Post Acute Care Choice:    Choice offered to:     DME Arranged:    DME Agency:     HH Arranged:    HH Agency:     Status of Service:  In process, will continue to follow  If discussed at Long Length of Stay Meetings, dates discussed:    Additional Comments:  Patrick Estrada, Patrick [address] [place], RN 11/13/2017, 9:58 AM

## 2017-11-13 NOTE — Progress Notes (Signed)
PROGRESS NOTE  Patrick Estrada ZOX:[id number] DOB: [birth year] DOA: 11/11/2017 PCP: Julieanne Manson, MD  HPI/Recap of past 24 hours: Patrick Estrada is a 57 y.o. male with medical history significant of HTN presenting with worsening SOB and cough for the past few weeks. No sick contacts.  +tobacco, 1 ppd, 42 year history. Pt was seen in the ER on 8/5, CXR showed pleural effusion. Pt was prescribed antibiotics for PNA the last time he was in the ER; he took the antibiotic but it made him sicker so he stopped taking them (n/v). Repeat CXR showed worsening L pleural effusion requiring L sided thoracentesis by IR showing transudative fluid. Pt admitted for further management.   Today, pt reported feeling better, reports lying on his L side makes him cough more. Denies any worsening SOB, chest pain, fever/chills.  Assessment/Plan: Principal Problem:   Acute respiratory failure with hypoxia (HCC) Active Problems:   Essential hypertension   Alcohol dependence (HCC)   Pleural effusion on left   Tobacco dependence   CAP (community acquired pneumonia)  Acute respiratory failure 2/2 CAP with L sided para-pneumonic effusion Improving, not requiring O2 Afebrile with no leukocytosis BC X 2 NGTD Procalcitonin <0.10 CXR post thoracentesis showed remaining moderate left pleural effusion with consolidation in the left lower lobe and lingula.  No pneumothorax Will repeat in am S/P thoracentesis on 11/11/17 yielded 1.1L of amber fluid, procedure stopped due to sig cough. Light's criteria showed exudative pleural effusion Pleural fluid culture shows no growth so far Scheduled duonebs Continue IV Zosyn  HTN Stable Not on any meds at home IV prn hydralazine  Chronic diastolic HF Stable ECHO showed EF 55%-60%, G1 DD   ETOH dependence Denies any recent heavy alcohol use Observe on CIWA protocol  Tobacco dependence Encourage cessation Nicotine Patch ordered    Code Status: Full  Family  Communication: None at bedside  Disposition Plan: Once significant improvement   Consultants:  IR  Procedures:  Thoracentesis on 8/20  Antimicrobials:  Zosyn  DVT prophylaxis: Lovenox   Objective: Vitals:   11/12/17 1717 11/12/17 2352 11/13/17 0532 11/13/17 0846  BP: (!) 121/97 99/81 (!) 125/99 111/86  Pulse: 80 81 75 78  Resp: 20     Temp: 98.4 F (36.9 C) 98.5 F (36.9 C)  98.1 F (36.7 C)  TempSrc: Oral Oral  Oral  SpO2: 99% 96%  100%  Weight:   67.1 kg   Height:        Intake/Output Summary (Last 24 hours) at 11/13/2017 1621 Last data filed at 11/12/2017 1955 Gross per 24 hour  Intake 340 ml  Output 0 ml  Net 340 ml   Filed Weights   11/11/17 0641 11/11/17 1652 11/13/17 0532  Weight: 74.8 kg 67.1 kg 67.1 kg    Exam:   General: NAD  Cardiovascular: S1, S2 present  Respiratory: Bilateral wheezing noted  Abdomen: Soft, nontender, nondistended, bowel sounds present  Musculoskeletal: No pedal edema bilaterally  Skin: Normal  Psychiatry: Normal mood   Data Reviewed: CBC: Recent Labs  Lab 11/11/17 0548 11/12/17 0234 11/13/17 0320  WBC 6.4 6.7 5.3  NEUTROABS 4.1  --  2.8  HGB 15.0 15.0 15.2  HCT 44.2 44.1 45.1  MCV 93.1 93.2 94.2  PLT 365 354 336   Basic Metabolic Panel: Recent Labs  Lab 11/11/17 0548 11/12/17 0234 11/13/17 0320  NA 136 134* 137  K 4.2 3.8 3.7  CL 102 102 106  CO2 23 25 25   GLUCOSE 87  135* 103*  BUN 9 7 5*  CREATININE 0.88 1.04 1.04  CALCIUM 8.8* 8.2* 8.4*   GFR: Estimated Creatinine Clearance: 74.4 mL/min (by C-G formula based on SCr of 1.04 mg/dL). Liver Function Tests: Recent Labs  Lab 11/12/17 0234  AST 24  ALT 14  ALKPHOS 52  BILITOT 0.8  PROT 6.1*  ALBUMIN 2.4*   No results for input(s): LIPASE, AMYLASE in the last 168 hours. No results for input(s): AMMONIA in the last 168 hours. Coagulation Profile: No results for input(s): INR, PROTIME in the last 168 hours. Cardiac Enzymes: No  results for input(s): CKTOTAL, CKMB, CKMBINDEX, TROPONINI in the last 168 hours. BNP (last 3 results) No results for input(s): PROBNP in the last 8760 hours. HbA1C: No results for input(s): HGBA1C in the last 72 hours. CBG: No results for input(s): GLUCAP in the last 168 hours. Lipid Profile: No results for input(s): CHOL, HDL, LDLCALC, TRIG, CHOLHDL, LDLDIRECT in the last 72 hours. Thyroid Function Tests: No results for input(s): TSH, T4TOTAL, FREET4, T3FREE, THYROIDAB in the last 72 hours. Anemia Panel: No results for input(s): VITAMINB12, FOLATE, FERRITIN, TIBC, IRON, RETICCTPCT in the last 72 hours. Urine analysis:    Component Value Date/Time   COLORURINE YELLOW 10/27/2017 1247   APPEARANCEUR CLEAR 10/27/2017 1247   LABSPEC 1.010 10/27/2017 1247   PHURINE 5.0 10/27/2017 1247   GLUCOSEU NEGATIVE 10/27/2017 1247   HGBUR NEGATIVE 10/27/2017 1247   BILIRUBINUR NEGATIVE 10/27/2017 1247   KETONESUR NEGATIVE 10/27/2017 1247   PROTEINUR NEGATIVE 10/27/2017 1247   UROBILINOGEN 0.2 06/02/2014 1506   NITRITE NEGATIVE 10/27/2017 1247   LEUKOCYTESUR NEGATIVE 10/27/2017 1247   Sepsis Labs: @LABRCNTIP (procalcitonin:4,lacticidven:4)  ) Recent Results (from the past 240 hour(s))  Culture, body fluid-bottle     Status: None (Preliminary result)   Collection Time: 11/11/17  1:00 PM  Result Value Ref Range Status   Specimen Description PLEURAL LEFT  Final   Special Requests NONE  Final   Culture   Final    NO GROWTH 2 DAYS Performed at [facility], [address] [place], [place] [address]    Report Status PENDING  Incomplete  Gram stain     Status: None   Collection Time: 11/11/17  1:00 PM  Result Value Ref Range Status   Specimen Description PLEURAL LEFT  Final   Special Requests NONE  Final   Gram Stain   Final    FEW WBC PRESENT, PREDOMINANTLY MONONUCLEAR NO ORGANISMS SEEN Performed at [facility], [address] [place], [place] [address]    Report Status  11/11/2017 FINAL  Final  Culture, blood (routine x 2)     Status: None (Preliminary result)   Collection Time: 11/12/17  3:47 PM  Result Value Ref Range Status   Specimen Description BLOOD LEFT WRIST  Final   Special Requests   Final    BOTTLES DRAWN AEROBIC ONLY Blood Culture adequate volume   Culture   Final    NO GROWTH < 24 HOURS Performed at [facility], [address] [place], [place] [address]    Report Status PENDING  Incomplete  Culture, blood (routine x 2)     Status: None (Preliminary result)   Collection Time: 11/12/17  3:51 PM  Result Value Ref Range Status   Specimen Description BLOOD LEFT HAND  Final   Special Requests   Final    BOTTLES DRAWN AEROBIC ONLY Blood Culture adequate volume   Culture   Final  NO GROWTH < 24 HOURS Performed at [facility], [address], [place], [place] [address]    Report Status PENDING  Incomplete      Studies: No results found.  Scheduled Meds: . docusate sodium  100 mg Oral BID  . enoxaparin (LOVENOX) injection  40 mg Subcutaneous Q24H  . folic acid  1 mg Oral Daily  . ipratropium-albuterol  3 mL Nebulization Q8H  . multivitamin with minerals  1 tablet Oral Daily  . nicotine  14 mg Transdermal Daily  . thiamine  100 mg Oral Daily   Or  . thiamine  100 mg Intravenous Daily    Continuous Infusions: . piperacillin-tazobactam (ZOSYN)  IV 3.375 g (11/13/17 1130)     LOS: 1 day     Briant Cedar, MD [facility]   If 7PM-7AM, please contact night-coverage [email or web address] Password [facility] 11/13/2017, 4:21 PM

## 2017-11-14 ENCOUNTER — Inpatient Hospital Stay (HOSPITAL_COMMUNITY): Payer: Medicaid Other

## 2017-11-14 DIAGNOSIS — J90 Pleural effusion, not elsewhere classified: Secondary | ICD-10-CM

## 2017-11-14 DIAGNOSIS — J9601 Acute respiratory failure with hypoxia: Secondary | ICD-10-CM

## 2017-11-14 DIAGNOSIS — J189 Pneumonia, unspecified organism: Principal | ICD-10-CM

## 2017-11-14 DIAGNOSIS — I10 Essential (primary) hypertension: Secondary | ICD-10-CM

## 2017-11-14 DIAGNOSIS — F17200 Nicotine dependence, unspecified, uncomplicated: Secondary | ICD-10-CM

## 2017-11-14 LAB — CBC WITH DIFFERENTIAL/PLATELET
Abs Immature Granulocytes: 0 10*3/uL (ref 0.0–0.1)
BASOS PCT: 1 %
Basophils Absolute: 0 10*3/uL (ref 0.0–0.1)
EOS ABS: 0 10*3/uL (ref 0.0–0.7)
EOS PCT: 1 %
HCT: 43.8 % (ref 39.0–52.0)
Hemoglobin: 14.9 g/dL (ref 13.0–17.0)
Immature Granulocytes: 0 %
Lymphocytes Relative: 26 %
Lymphs Abs: 1.3 10*3/uL (ref 0.7–4.0)
MCH: 31.5 pg (ref 26.0–34.0)
MCHC: 34 g/dL (ref 30.0–36.0)
MCV: 92.6 fL (ref 78.0–100.0)
MONO ABS: 0.8 10*3/uL (ref 0.1–1.0)
MONOS PCT: 15 %
NEUTROS PCT: 57 %
Neutro Abs: 3 10*3/uL (ref 1.7–7.7)
PLATELETS: 313 10*3/uL (ref 150–400)
RBC: 4.73 MIL/uL (ref 4.22–5.81)
RDW: 12.9 % (ref 11.5–15.5)
WBC: 5.2 10*3/uL (ref 4.0–10.5)

## 2017-11-14 LAB — BASIC METABOLIC PANEL
Anion gap: 7 (ref 5–15)
CALCIUM: 8.6 mg/dL — AB (ref 8.9–10.3)
CO2: 22 mmol/L (ref 22–32)
Chloride: 106 mmol/L (ref 98–111)
Creatinine, Ser: 0.87 mg/dL (ref 0.61–1.24)
GFR calc Af Amer: >60 mL/min (ref >60)
GFR calc non Af Amer: >60 mL/min (ref >60)
GLUCOSE: 112 mg/dL — AB (ref 70–99)
Potassium: 3.7 mmol/L (ref 3.5–5.1)
Sodium: 135 mmol/L (ref 135–145)

## 2017-11-14 MED ORDER — AMOXICILLIN-POT CLAVULANATE 875-125 MG PO TABS
1.0000 | ORAL_TABLET | Freq: Two times a day (BID) | ORAL | Status: DC
  Administered 2017-11-14: 1 via ORAL
  Filled 2017-11-14: qty 1

## 2017-11-14 MED ORDER — NICOTINE 21 MG/24HR TD PT24: 21.0000 mg | MEDICATED_PATCH | Freq: Every day | TRANSDERMAL | 0 refills | Status: AC

## 2017-11-14 MED ORDER — ALBUTEROL SULFATE HFA 108 (90 BASE) MCG/ACT IN AERS: 2.0000 | INHALATION_SPRAY | RESPIRATORY_TRACT | 2 refills | Status: DC | PRN

## 2017-11-14 MED ORDER — AMOXICILLIN-POT CLAVULANATE 875-125 MG PO TABS: 1.0000 | ORAL_TABLET | Freq: Two times a day (BID) | ORAL | 0 refills | Status: AC

## 2017-11-14 NOTE — Discharge Instructions (Signed)

## 2017-11-14 NOTE — Progress Notes (Signed)
Pt dc home in stable condition walked out himself
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
## 2017-11-16 LAB — CULTURE, BODY FLUID W GRAM STAIN -BOTTLE: Culture: NO GROWTH

## 2017-11-16 LAB — CULTURE, BODY FLUID-BOTTLE

## 2017-11-17 LAB — CULTURE, BLOOD (ROUTINE X 2)
CULTURE: NO GROWTH
Culture: NO GROWTH
SPECIAL REQUESTS: ADEQUATE
SPECIAL REQUESTS: ADEQUATE

## 2017-12-16 ENCOUNTER — Ambulatory Visit (INDEPENDENT_AMBULATORY_CARE_PROVIDER_SITE_OTHER): Payer: Self-pay | Admitting: Physician Assistant

## 2017-12-16 ENCOUNTER — Other Ambulatory Visit: Payer: Self-pay

## 2017-12-16 ENCOUNTER — Encounter (INDEPENDENT_AMBULATORY_CARE_PROVIDER_SITE_OTHER): Payer: Self-pay | Admitting: Physician Assistant

## 2017-12-16 VITALS — BP 141/93 | HR 100 | Temp 98.3°F | Ht 69.0 in | Wt 149.0 lb

## 2017-12-16 DIAGNOSIS — I10 Essential (primary) hypertension: Secondary | ICD-10-CM

## 2017-12-16 DIAGNOSIS — Z1211 Encounter for screening for malignant neoplasm of colon: Secondary | ICD-10-CM

## 2017-12-16 DIAGNOSIS — Z131 Encounter for screening for diabetes mellitus: Secondary | ICD-10-CM

## 2017-12-16 DIAGNOSIS — Z8739 Personal history of other diseases of the musculoskeletal system and connective tissue: Secondary | ICD-10-CM

## 2017-12-16 DIAGNOSIS — R059 Cough, unspecified: Secondary | ICD-10-CM

## 2017-12-16 DIAGNOSIS — R3911 Hesitancy of micturition: Secondary | ICD-10-CM

## 2017-12-16 DIAGNOSIS — R05 Cough: Secondary | ICD-10-CM

## 2017-12-16 DIAGNOSIS — Z09 Encounter for follow-up examination after completed treatment for conditions other than malignant neoplasm: Secondary | ICD-10-CM

## 2017-12-16 DIAGNOSIS — Z125 Encounter for screening for malignant neoplasm of prostate: Secondary | ICD-10-CM

## 2017-12-16 LAB — POCT GLYCOSYLATED HEMOGLOBIN (HGB A1C): Hemoglobin A1C: 5.4 % (ref 4.0–5.6)

## 2017-12-16 MED ORDER — AMLODIPINE BESYLATE 5 MG PO TABS: 5.0000 mg | ORAL_TABLET | Freq: Every day | ORAL | 6 refills | Status: DC

## 2017-12-16 MED ORDER — NAPROXEN 500 MG PO TABS: 500.0000 mg | ORAL_TABLET | Freq: Two times a day (BID) | ORAL | 0 refills | Status: DC

## 2017-12-16 NOTE — Progress Notes (Signed)
Subjective:  Patient ID: Patrick BowenRoger L Homes, male    DOB: 12/08/1960  Age: 57 y.o. MRN: [id number]  CC: Hospital f/u   HPI Patrick Estrada is a 57 y.o. male with a medical history of HTN, asthma, pleural effusion, alcoholism, tobacco abuse, CAP, and facial fracture presents as a new patient on hospital f/u. Hospital admission on 11/11/17 for left sided thoracocentesis for worsening pleural effusion. Discharged 11/14/17. Patient says he continues to cough and have exertional dyspnea. Denies CP, palpitations, HA, abdominal pain, f/c/n/v, rash, swelling, or GI/GU sxs.     Pt says he was observed to have right foot drop at the hospital. He was given a boot to keep foot at 90 degree angle. Would like a referral to the podiatrist.     Patient also concerned about weight loss. Weight 83.9 kg upon hospitalization on 10/27/17. Weight 67.9 kg today. Concerned about cancerous etiology. Has pain in the LLQ of abdomen and urinary hesitancy. Otherwise, does not endorse any other symptoms or complaints.    Outpatient Medications Prior to Visit  Medication Sig Dispense Refill  . albuterol (PROVENTIL HFA;VENTOLIN HFA) 108 (90 Base) MCG/ACT inhaler Inhale 2 puffs into the lungs every 4 (four) hours as needed for wheezing or shortness of breath. 1 Inhaler 2  . ibuprofen (ADVIL,MOTRIN) 600 MG tablet Take 1 tablet (600 mg total) by mouth every 6 (six) hours as needed. (Patient taking differently: Take 600 mg by mouth every 6 (six) hours as needed for moderate pain. ) 30 tablet 0   No facility-administered medications prior to visit.      ROS Review of Systems  Constitutional: Negative for chills, fever and malaise/fatigue.  Eyes: Negative for blurred vision.  Respiratory: Positive for cough. Negative for shortness of breath.   Cardiovascular: Negative for chest pain and palpitations.  Gastrointestinal: Positive for abdominal pain (LLQ). Negative for nausea.  Genitourinary: Negative for dysuria and hematuria.   Urinary hesitancy   Musculoskeletal: Negative for joint pain and myalgias.  Skin: Negative for rash.  Neurological: Negative for tingling and headaches.       Right foot drop  Psychiatric/Behavioral: Negative for depression. The patient is not nervous/anxious.     Objective:  BP (!) 141/93 (BP Location: Left Arm, Patient Position: Sitting, Cuff Size: Normal)   Pulse 100   Temp 98.3 F (36.8 C) (Oral)   Ht 5\' 9"  (1.753 m)   Wt 149 lb (67.6 kg)   SpO2 97%   BMI 22.00 kg/m   BP/Weight 12/16/2017 11/14/2017 11/13/2017  Systolic BP 141 123 -  Diastolic BP 93 85 -  Wt. (Lbs) 149 - 147.93  BMI 22 - 21.85  Some encounter information is confidential and restricted. Go to Review Flowsheets activity to see all data.      Physical Exam  Constitutional: He is oriented to person, place, and time.  Well developed, thin, NAD, polite, wearing orthopedic boot  HENT:  Head: Normocephalic and atraumatic.  Eyes: No scleral icterus.  Neck: Normal range of motion. Neck supple. No thyromegaly present.  Cardiovascular: Normal rate, regular rhythm and normal heart sounds.  Pulmonary/Chest: Effort normal and breath sounds normal.  Abdominal: Soft. Bowel sounds are normal. There is tenderness (mild LLQ TTP).  Musculoskeletal: He exhibits no edema.  Neurological: He is alert and oriented to person, place, and time.  Skin: Skin is warm and dry. No rash noted. No erythema. No pallor.  Psychiatric: He has a normal mood and affect. His behavior is normal. Thought content  normal.  Vitals reviewed.    Assessment & Plan:    1. Hospital discharge follow-up - Notes reviewed.   2. Cough - DG Chest 2 View; Future - Begin naproxen (NAPROSYN) 500 MG tablet; Take 1 tablet (500 mg total) by mouth 2 (two) times daily with a meal.  Dispense: 30 tablet; Refill: 0  3. Urinary hesitancy - PSA  4. Hypertension, unspecified type - Begin amLODipine (NORVASC) 5 MG tablet; Take 1 tablet (5 mg total) by mouth  daily.  Dispense: 30 tablet; Refill: 6  5. Screening for diabetes mellitus - HgB A1c 5.4% today  6. Screening for prostate cancer - PSA  7. Special screening for malignant neoplasms, colon - Fecal occult blood, imunochemical; Future  8. History of right foot drop - Needs to complete CAFA. Asked for him to call so that I may refer to podiatry.    Meds ordered this encounter  Medications  . amLODipine (NORVASC) 5 MG tablet    Sig: Take 1 tablet (5 mg total) by mouth daily.    Dispense:  30 tablet    Refill:  6    Order Specific Question:   Supervising Provider    Answer:   Hoy Register [[id number]]  . naproxen (NAPROSYN) 500 MG tablet    Sig: Take 1 tablet (500 mg total) by mouth 2 (two) times daily with a meal.    Dispense:  30 tablet    Refill:  0    Order Specific Question:   Supervising Provider    Answer:   Hoy Register [[id number]]    Follow-up: Return in about 4 weeks (around 01/13/2018) for HTN.   Loletta Specter PA

## 2017-12-16 NOTE — Patient Instructions (Signed)
Pleural Effusion °A pleural effusion is an abnormal buildup of fluid in the layers of tissue between your lungs and the inside of your chest (pleural space). These two layers of tissue that line both your lungs and the inside of your chest are called pleura. Usually, there is no air in the space between the pleura, only a thin layer of fluid. If left untreated, a large amount of fluid can build up and cause the lung to collapse. A pleural effusion is usually caused by another disease that requires treatment. °The two main types of pleural effusion are: °· Transudative pleural effusion. This happens when fluid leaks into the pleural space because of a low protein count in your blood or high blood pressure in your vessels. Heart failure often causes this. °· Exudative infusion. This occurs when fluid collects in the pleural space from blocked blood vessels or lymph vessels. Some lung diseases, injuries, and cancers can cause this type of effusion. ° °What are the causes? °Pleural effusion can be caused by: °· Heart failure. °· A blood clot in the lung (pulmonary embolism). °· Pneumonia. °· Cancer. °· Liver failure (cirrhosis). °· Kidney disease. °· Complications from surgery, such as from open heart surgery. ° °What are the signs or symptoms? °In some cases, pleural effusion may cause no symptoms. Symptoms can include: °· Shortness of breath, especially when lying down. °· Chest pain, often worse when taking a deep breath. °· Fever. °· Dry cough that is lasting (chronic). °· Hiccups. °· Rapid breathing. ° °An underlying condition that is causing the pleural effusion (such as heart failure, pneumonia, blood clots, tuberculosis, or cancer) may also cause additional symptoms. °How is this diagnosed? °Your health care provider may suspect pleural effusion based on your symptoms and medical history. Your health care provider will also do a physical exam and a chest X-ray. If the X-ray shows there is fluid in your chest,  you may need to have this fluid removed using a needle (thoracentesis) so it can be tested. °You may also have: °· Imaging studies of the chest, such as: °? Ultrasound. °? CT scan. °· Blood tests for kidney and liver function. ° °How is this treated? °Treatment depends on the cause of the pleural effusion. Treatment may include: °· Taking antibiotic medicines to clear up an infection that is causing the pleural effusion. °· Placing a tube in the chest to drain the effusion (tube thoracostomy). This procedure is often used when there is an infection in the fluid. °· Surgery to remove the fibrous outer layer of tissue from the pleural space (decortication). °· Thoracentesis, which can improve cough and shortness of breath. °· A procedure to put medicine into the chest cavity to seal the pleural space to prevent fluid buildup (pleurodesis). °· Chemotherapy and radiation therapy. These may be required in the case of cancerous (malignant) pleural effusion. ° °Follow these instructions at home: °· Take medicines only as directed by your health care provider. °· Keep track of how long you can gently exercise before you get short of breath. Try simply walking at first. °· Do not use any tobacco products, including cigarettes, chewing tobacco, or electronic cigarettes. If you need help quitting, ask your health care provider. °· Keep all follow-up visits as directed by your health care provider. This is important. °Contact a health care provider if: °· The amount of time that you are able to exercise decreases or does not improve with time. °· You have pain or signs of infection   at the puncture site if you had thoracentesis. Watch for: °? Drainage. °? Redness. °? Swelling. °· You have a fever. °Get help right away if: °· You are short of breath. °· You develop chest pain. °· You develop a new cough. °This information is not intended to replace advice given to you by your health care provider. Make sure you discuss any  questions you have with your health care provider. °Document Released: 03/11/2005 Document Revised: 08/14/2015 Document Reviewed: 08/04/2013 °Elsevier Interactive Patient Education © 2018 [employer] ° °

## 2017-12-17 ENCOUNTER — Telehealth (INDEPENDENT_AMBULATORY_CARE_PROVIDER_SITE_OTHER): Payer: Self-pay

## 2017-12-17 LAB — PSA: PROSTATE SPECIFIC AG, SERUM: 1.1 ng/mL (ref 0.0–4.0)

## 2017-12-17 NOTE — Telephone Encounter (Signed)
Patient returned call to [facility] and was informed that PSA is within normal limits. Maryjean Morn, CMA

## 2017-12-17 NOTE — Telephone Encounter (Signed)
-----   Message from Loletta Specteroger David Gomez, PA-C sent at 12/17/2017  1:39 PM EDT ----- PSA within normal limits.

## 2017-12-17 NOTE — Telephone Encounter (Signed)
-----   Message from Abel David Gomez, PA-C sent at 12/17/2017  1:39 PM EDT ----- PSA within normal limits. 

## 2017-12-17 NOTE — Telephone Encounter (Signed)
Left message asking patient to return call to [facility] at [phone]. Tempestt S Roberts, CMA  

## 2018-01-16 ENCOUNTER — Ambulatory Visit (INDEPENDENT_AMBULATORY_CARE_PROVIDER_SITE_OTHER): Payer: Self-pay | Admitting: Physician Assistant

## 2018-02-03 ENCOUNTER — Ambulatory Visit (INDEPENDENT_AMBULATORY_CARE_PROVIDER_SITE_OTHER): Payer: Self-pay | Admitting: Physician Assistant

## 2018-02-03 ENCOUNTER — Encounter (INDEPENDENT_AMBULATORY_CARE_PROVIDER_SITE_OTHER): Payer: Self-pay | Admitting: Physician Assistant

## 2018-02-03 ENCOUNTER — Other Ambulatory Visit: Payer: Self-pay

## 2018-02-03 VITALS — BP 113/84 | HR 120 | Temp 98.5°F | Ht 69.0 in | Wt 150.2 lb

## 2018-02-03 DIAGNOSIS — R3911 Hesitancy of micturition: Secondary | ICD-10-CM

## 2018-02-03 DIAGNOSIS — R059 Cough, unspecified: Secondary | ICD-10-CM

## 2018-02-03 DIAGNOSIS — R509 Fever, unspecified: Secondary | ICD-10-CM

## 2018-02-03 DIAGNOSIS — Z1211 Encounter for screening for malignant neoplasm of colon: Secondary | ICD-10-CM

## 2018-02-03 DIAGNOSIS — R05 Cough: Secondary | ICD-10-CM

## 2018-02-03 DIAGNOSIS — R61 Generalized hyperhidrosis: Secondary | ICD-10-CM

## 2018-02-03 DIAGNOSIS — R000 Tachycardia, unspecified: Secondary | ICD-10-CM

## 2018-02-03 MED ORDER — NAPROXEN 500 MG PO TABS: 500.0000 mg | ORAL_TABLET | Freq: Two times a day (BID) | ORAL | 0 refills | Status: DC

## 2018-02-03 MED ORDER — CIPROFLOXACIN HCL 500 MG PO TABS: 500.0000 mg | ORAL_TABLET | Freq: Two times a day (BID) | ORAL | 0 refills | Status: DC

## 2018-02-03 MED ORDER — TAMSULOSIN HCL 0.4 MG PO CAPS: 0.4000 mg | ORAL_CAPSULE | Freq: Every day | ORAL | 3 refills | Status: DC

## 2018-02-03 MED ORDER — AMOXICILLIN-POT CLAVULANATE 875-125 MG PO TABS: 1.0000 | ORAL_TABLET | Freq: Two times a day (BID) | ORAL | 0 refills | Status: DC

## 2018-02-03 MED ORDER — ALBUTEROL SULFATE HFA 108 (90 BASE) MCG/ACT IN AERS: 2.0000 | INHALATION_SPRAY | RESPIRATORY_TRACT | 2 refills | Status: DC | PRN

## 2018-02-03 NOTE — Patient Instructions (Signed)

## 2018-02-03 NOTE — Progress Notes (Signed)
Subjective:  Patient ID: Patrick BowenRoger L Estrada, male    DOB: 03/07/1961  Age: 57 y.o. MRN: [id number]  CC: HTN  HPI Patrick BowenRoger L Estrada is a 57 y.o. male with a medical history of HTN, asthma, pleural effusion, alcoholism, tobacco abuse, CAP, and facial fracture presents to f/u on HTN. Last BP 141/93 mmHg six weeks ago. Was prescribed Amlodipine 5 mg but not taking Amlodipine 5 mg because he did not know he was prescribed Amlodipine. Chest xray was ordered for cough but patient did not have done. Says he did not know he should have gotten a chest xray. Says he is experiencing cough, fever, chills, night sweats, and urinary hesitancy (PSA 1.1 ng/mL six weeks ago), and fluid in his lungs. He is noted to be tachycardic at 120 bpm in clinic today. Not taking anything for relief of symptoms. Denies CP, palpitations, SOB, abdominal pain, HA, rash, swelling, or GI/GU sx.     Outpatient Medications Prior to Visit  Medication Sig Dispense Refill  . albuterol (PROVENTIL HFA;VENTOLIN HFA) 108 (90 Base) MCG/ACT inhaler Inhale 2 puffs into the lungs every 4 (four) hours as needed for wheezing or shortness of breath. (Patient not taking: Reported on 02/03/2018) 1 Inhaler 2  . amLODipine (NORVASC) 5 MG tablet Take 1 tablet (5 mg total) by mouth daily. (Patient not taking: Reported on 02/03/2018) 30 tablet 6  . ibuprofen (ADVIL,MOTRIN) 600 MG tablet Take 1 tablet (600 mg total) by mouth every 6 (six) hours as needed. (Patient not taking: Reported on 02/03/2018) 30 tablet 0  . naproxen (NAPROSYN) 500 MG tablet Take 1 tablet (500 mg total) by mouth 2 (two) times daily with a meal. (Patient not taking: Reported on 02/03/2018) 30 tablet 0   No facility-administered medications prior to visit.      ROS Review of Systems  Constitutional: Positive for chills, diaphoresis, fever and malaise/fatigue.  Eyes: Negative for blurred vision.  Respiratory: Positive for cough. Negative for shortness of breath.   Cardiovascular: Negative  for chest pain and palpitations.  Gastrointestinal: Negative for abdominal pain and nausea.  Genitourinary: Negative for dysuria and hematuria.       Hesitancy  Musculoskeletal: Negative for joint pain and myalgias.  Skin: Negative for rash.  Neurological: Negative for tingling and headaches.  Psychiatric/Behavioral: Negative for depression. The patient is not nervous/anxious.     Objective:  BP 135/81 (BP Location: Left Arm, Patient Position: Sitting, Cuff Size: Normal)   Pulse (!) 120   Temp 98.5 F (36.9 C) (Oral)   Ht 5\' 9"  (1.753 m)   Wt 150 lb 3.2 oz (68.1 kg)   SpO2 97%   BMI 22.18 kg/m   BP/Weight 02/03/2018 12/16/2017 11/14/2017  Systolic BP 135 141 123  Diastolic BP 81 93 85  Wt. (Lbs) 150.2 149 -  BMI 22.18 22 -  Some encounter information is confidential and restricted. Go to Review Flowsheets activity to see all data.      Physical Exam  Constitutional: He is oriented to person, place, and time.  Well developed, well nourished, NAD, polite  HENT:  Head: Normocephalic and atraumatic.  Eyes: No scleral icterus.  Neck: Normal range of motion. Neck supple. No thyromegaly present.  Cardiovascular: Normal rate, regular rhythm and normal heart sounds.  Pulmonary/Chest: Effort normal. No respiratory distress. He has no wheezes.  Rhonchi in the lung base bilaterally  Musculoskeletal: He exhibits no edema.  Neurological: He is alert and oriented to person, place, and time.  Skin: Skin is  warm and dry. No rash noted. No erythema. No pallor.  Psychiatric: He has a normal mood and affect. His behavior is normal. Thought content normal.  Vitals reviewed.    Assessment & Plan:   1. Cough - DG Chest 2 View; Future - naproxen (NAPROSYN) 500 MG tablet; Take 1 tablet (500 mg total) by mouth 2 (two) times daily with a meal.  Dispense: 30 tablet; Refill: 0 - albuterol (PROVENTIL HFA;VENTOLIN HFA) 108 (90 Base) MCG/ACT inhaler; Inhale 2 puffs into the lungs every 4 (four)  hours as needed for wheezing or shortness of breath.  Dispense: 1 Inhaler; Refill: 2 - CBC with Differential - Basic Metabolic Panel - amoxicillin-clavulanate (AUGMENTIN) 875-125 MG tablet; Take 1 tablet by mouth 2 (two) times daily for 10 days.  Dispense: 20 tablet; Refill: 0  2. Night sweats - CBC with Differential - amoxicillin-clavulanate (AUGMENTIN) 875-125 MG tablet; Take 1 tablet by mouth 2 (two) times daily for 10 days.  Dispense: 20 tablet; Refill: 0  3. Fever and chills - CBC with Differential - amoxicillin-clavulanate (AUGMENTIN) 875-125 MG tablet; Take 1 tablet by mouth 2 (two) times daily for 10 days.  Dispense: 20 tablet; Refill: 0  4. Tachycardia - CBC with Differential - Basic Metabolic Panel - TSH  5. Urinary hesitancy - tamsulosin (FLOMAX) 0.4 MG CAPS capsule; Take 1 capsule (0.4 mg total) by mouth daily.  Dispense: 30 capsule; Refill: 3 - ciprofloxacin (CIPRO) 500 MG tablet; Take 1 tablet (500 mg total) by mouth 2 (two) times daily.  Dispense: 84 tablet; Refill: 0  6. Special screening for malignant neoplasms, colon - Fecal occult blood, imunochemical   Meds ordered this encounter  Medications  . naproxen (NAPROSYN) 500 MG tablet    Sig: Take 1 tablet (500 mg total) by mouth 2 (two) times daily with a meal.    Dispense:  30 tablet    Refill:  0    Order Specific Question:   Supervising Provider    Answer:   Hoy Register [[id number]]  . albuterol (PROVENTIL HFA;VENTOLIN HFA) 108 (90 Base) MCG/ACT inhaler    Sig: Inhale 2 puffs into the lungs every 4 (four) hours as needed for wheezing or shortness of breath.    Dispense:  1 Inhaler    Refill:  2    Order Specific Question:   Supervising Provider    Answer:   Hoy Register [[id number]]  . tamsulosin (FLOMAX) 0.4 MG CAPS capsule    Sig: Take 1 capsule (0.4 mg total) by mouth daily.    Dispense:  30 capsule    Refill:  3    Order Specific Question:   Supervising Provider    Answer:   Hoy Register [[id number]]  .  ciprofloxacin (CIPRO) 500 MG tablet    Sig: Take 1 tablet (500 mg total) by mouth 2 (two) times daily.    Dispense:  84 tablet    Refill:  0    Order Specific Question:   Supervising Provider    Answer:   Hoy Register [[id number]]  . amoxicillin-clavulanate (AUGMENTIN) 875-125 MG tablet    Sig: Take 1 tablet by mouth 2 (two) times daily for 10 days.    Dispense:  20 tablet    Refill:  0    Order Specific Question:   Supervising Provider    Answer:   Hoy Register [[id number]]    Follow-up: Return in about 4 weeks (around 03/03/2018) for cough.   Loletta Specter PA

## 2018-02-04 ENCOUNTER — Ambulatory Visit (HOSPITAL_COMMUNITY)
Admission: RE | Admit: 2018-02-04 | Discharge: 2018-02-04 | Disposition: A | Discharge status: Home / Self Care | Payer: Medicaid Other | Source: Ambulatory Visit | Attending: Physician Assistant | Admitting: Physician Assistant

## 2018-02-04 ENCOUNTER — Telehealth (INDEPENDENT_AMBULATORY_CARE_PROVIDER_SITE_OTHER): Payer: Self-pay

## 2018-02-04 ENCOUNTER — Other Ambulatory Visit (INDEPENDENT_AMBULATORY_CARE_PROVIDER_SITE_OTHER): Payer: Self-pay | Admitting: Physician Assistant

## 2018-02-04 DIAGNOSIS — R05 Cough: Secondary | ICD-10-CM | POA: Diagnosis not present

## 2018-02-04 DIAGNOSIS — J90 Pleural effusion, not elsewhere classified: Secondary | ICD-10-CM | POA: Diagnosis not present

## 2018-02-04 DIAGNOSIS — J181 Lobar pneumonia, unspecified organism: Principal | ICD-10-CM

## 2018-02-04 DIAGNOSIS — R918 Other nonspecific abnormal finding of lung field: Secondary | ICD-10-CM | POA: Insufficient documentation

## 2018-02-04 DIAGNOSIS — J189 Pneumonia, unspecified organism: Secondary | ICD-10-CM

## 2018-02-04 DIAGNOSIS — R059 Cough, unspecified: Secondary | ICD-10-CM

## 2018-02-04 NOTE — Telephone Encounter (Signed)
Left message asking patient to return call to [facility] at [phone]. Tempestt S Roberts, CMA  

## 2018-02-04 NOTE — Telephone Encounter (Signed)
-----   Message from Loletta Specteroger David Gomez, PA-C sent at 02/04/2018 12:18 PM EST ----- Suspected pneumonia of the left upper lung. Take antibiotics as directed. Repeat chest xray in 3-4 weeks. I have already put in order for chest xray.

## 2018-02-04 NOTE — Telephone Encounter (Signed)
Patient was unavailable. His sister Gershon Cullriscilla is aware that patient has suspected pneumonia of the left upper lung. Take antibiotics as directed. Repeat chest Xray in 3-4 weeks, order has been placed for Xray. Sister will inform patient. Maryjean Mornempestt S Roberts, CMA

## 2018-02-04 NOTE — Telephone Encounter (Signed)
-----   Message from Elio David Gomez, PA-C sent at 02/04/2018 12:18 PM EST ----- Suspected pneumonia of the left upper lung. Take antibiotics as directed. Repeat chest xray in 3-4 weeks. I have already put in order for chest xray. 

## 2018-02-05 ENCOUNTER — Telehealth (INDEPENDENT_AMBULATORY_CARE_PROVIDER_SITE_OTHER): Payer: Self-pay

## 2018-02-05 LAB — FECAL OCCULT BLOOD, IMMUNOCHEMICAL: FECAL OCCULT BLD: NEGATIVE

## 2018-02-05 NOTE — Telephone Encounter (Signed)
Patient was not available. Patients sister made aware that patient FIT is negative. She will inform patient when he returns home. Maryjean Mornempestt S Roberts, CMA

## 2018-02-05 NOTE — Telephone Encounter (Signed)
-----   Message from Loletta Specteroger David Gomez, PA-C sent at 02/05/2018  8:41 AM EST ----- Fit negative.

## 2018-02-10 ENCOUNTER — Encounter (HOSPITAL_COMMUNITY): Payer: Self-pay

## 2018-02-10 ENCOUNTER — Emergency Department (HOSPITAL_COMMUNITY)
Admission: EM | Admit: 2018-02-10 | Discharge: 2018-02-10 | Disposition: A | Discharge status: Home / Self Care | Payer: Medicaid Other | Attending: Emergency Medicine | Admitting: Emergency Medicine

## 2018-02-10 ENCOUNTER — Other Ambulatory Visit: Payer: Self-pay

## 2018-02-10 ENCOUNTER — Emergency Department (HOSPITAL_COMMUNITY): Payer: Medicaid Other

## 2018-02-10 DIAGNOSIS — J45909 Unspecified asthma, uncomplicated: Secondary | ICD-10-CM | POA: Diagnosis not present

## 2018-02-10 DIAGNOSIS — R509 Fever, unspecified: Secondary | ICD-10-CM

## 2018-02-10 DIAGNOSIS — I10 Essential (primary) hypertension: Secondary | ICD-10-CM | POA: Insufficient documentation

## 2018-02-10 DIAGNOSIS — J181 Lobar pneumonia, unspecified organism: Secondary | ICD-10-CM | POA: Diagnosis not present

## 2018-02-10 DIAGNOSIS — R05 Cough: Secondary | ICD-10-CM

## 2018-02-10 DIAGNOSIS — F17210 Nicotine dependence, cigarettes, uncomplicated: Secondary | ICD-10-CM | POA: Insufficient documentation

## 2018-02-10 DIAGNOSIS — J90 Pleural effusion, not elsewhere classified: Secondary | ICD-10-CM | POA: Insufficient documentation

## 2018-02-10 DIAGNOSIS — R61 Generalized hyperhidrosis: Secondary | ICD-10-CM

## 2018-02-10 DIAGNOSIS — R059 Cough, unspecified: Secondary | ICD-10-CM

## 2018-02-10 DIAGNOSIS — J189 Pneumonia, unspecified organism: Secondary | ICD-10-CM

## 2018-02-10 DIAGNOSIS — R0602 Shortness of breath: Secondary | ICD-10-CM | POA: Diagnosis present

## 2018-02-10 LAB — CBC WITH DIFFERENTIAL/PLATELET
Abs Immature Granulocytes: 0.06 10*3/uL (ref 0.00–0.07)
Basophils Absolute: 0 10*3/uL (ref 0.0–0.1)
Basophils Relative: 1 %
Eosinophils Absolute: 0 10*3/uL (ref 0.0–0.5)
Eosinophils Relative: 0 %
HCT: 42.8 % (ref 39.0–52.0)
Hemoglobin: 14 g/dL (ref 13.0–17.0)
Immature Granulocytes: 1 %
Lymphocytes Relative: 21 %
Lymphs Abs: 1.2 10*3/uL (ref 0.7–4.0)
MCH: 29.4 pg (ref 26.0–34.0)
MCHC: 32.7 g/dL (ref 30.0–36.0)
MCV: 89.7 fL (ref 80.0–100.0)
Monocytes Absolute: 1 10*3/uL (ref 0.1–1.0)
Monocytes Relative: 18 %
Neutro Abs: 3.4 10*3/uL (ref 1.7–7.7)
Neutrophils Relative %: 59 %
Platelets: 300 10*3/uL (ref 150–400)
RBC: 4.77 MIL/uL (ref 4.22–5.81)
RDW: 14.4 % (ref 11.5–15.5)
WBC: 5.7 10*3/uL (ref 4.0–10.5)
nRBC: 0 % (ref 0.0–0.2)

## 2018-02-10 LAB — I-STAT TROPONIN, ED: Troponin i, poc: 0.01 ng/mL (ref 0.00–0.08)

## 2018-02-10 LAB — BRAIN NATRIURETIC PEPTIDE: B Natriuretic Peptide: 17.7 pg/mL (ref 0.0–100.0)

## 2018-02-10 LAB — BASIC METABOLIC PANEL
Anion gap: 9 (ref 5–15)
BUN: 7 mg/dL (ref 6–20)
CO2: 24 mmol/L (ref 22–32)
Calcium: 9.2 mg/dL (ref 8.9–10.3)
Chloride: 100 mmol/L (ref 98–111)
Creatinine, Ser: 0.91 mg/dL (ref 0.61–1.24)
GFR calc Af Amer: >60 mL/min (ref >60)
GFR calc non Af Amer: >60 mL/min (ref >60)
Glucose, Bld: 88 mg/dL (ref 70–99)
Potassium: 3.7 mmol/L (ref 3.5–5.1)
Sodium: 133 mmol/L — ABNORMAL LOW (ref 135–145)

## 2018-02-10 MED ORDER — AMOXICILLIN-POT CLAVULANATE 875-125 MG PO TABS: 1.0000 | ORAL_TABLET | Freq: Two times a day (BID) | ORAL | 0 refills | Status: AC

## 2018-02-10 MED ORDER — IPRATROPIUM-ALBUTEROL 0.5-2.5 (3) MG/3ML IN SOLN
3.0000 mL | Freq: Once | RESPIRATORY_TRACT | Status: AC
  Administered 2018-02-10: 3 mL via RESPIRATORY_TRACT
  Filled 2018-02-10: qty 3
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
## 2018-02-10 NOTE — ED Notes (Signed)
Ambulated pt, pt SPO2 remained>96%. Denied SOB.

## 2018-02-10 NOTE — Discharge Instructions (Signed)
Please take all of your antibiotics until finished!   You may develop abdominal discomfort or diarrhea from the antibiotic.  You may help offset this with probiotics which you can buy or get in yogurt. Do not eat  or take the probiotics until 2 hours after your antibiotic.   Call your PCPs office tomorrow and tell them that you still have fluid in the left lung.  They will help get you set up for any procedures you might need.  They will also reevaluate you to make sure that your pneumonia has improved.  Return to the emergency department if any concerning signs or symptoms develop such as worsening shortness of breath, chest pains, lightheadedness, or passing out.

## 2018-02-10 NOTE — ED Triage Notes (Signed)
Pt was sent to ER by pcp for pleural effusion. Pt SOB with exertion

## 2018-02-11 ENCOUNTER — Other Ambulatory Visit (INDEPENDENT_AMBULATORY_CARE_PROVIDER_SITE_OTHER): Payer: Self-pay | Admitting: Physician Assistant

## 2018-02-11 ENCOUNTER — Telehealth (INDEPENDENT_AMBULATORY_CARE_PROVIDER_SITE_OTHER): Payer: Self-pay | Admitting: Physician Assistant

## 2018-02-11 DIAGNOSIS — J189 Pneumonia, unspecified organism: Secondary | ICD-10-CM

## 2018-02-11 DIAGNOSIS — J181 Lobar pneumonia, unspecified organism: Principal | ICD-10-CM

## 2018-02-11 NOTE — Telephone Encounter (Signed)
I have placed order for thoracentesis and for CXR to be performed after his thoracentesis. Please see that patient has scheduled asap.

## 2018-02-11 NOTE — Telephone Encounter (Signed)
FWD to PCP. Tempestt S Roberts, CMA  

## 2018-02-11 NOTE — Telephone Encounter (Signed)
Patient sister called stated that patient was instructed by ED to call PCP to schedule an outpatient thoracentesis to drain the fluid in his lungs and for repeat chest x-ray to make sure the pneumonia has improved.  Please Advice [phone]  Thank you Patrick SecondGloria I Vargas Estrada

## 2018-02-12 ENCOUNTER — Other Ambulatory Visit (INDEPENDENT_AMBULATORY_CARE_PROVIDER_SITE_OTHER): Payer: Self-pay | Admitting: Physician Assistant

## 2018-02-12 DIAGNOSIS — J189 Pneumonia, unspecified organism: Secondary | ICD-10-CM

## 2018-02-12 DIAGNOSIS — J181 Lobar pneumonia, unspecified organism: Principal | ICD-10-CM

## 2018-02-12 NOTE — Telephone Encounter (Signed)
Patients sister is aware that thoracentesis is scheduled for Monday November 25 at 1pm must [facility]. Maryjean Mornempestt S Jaimy Kliethermes, CMA

## 2018-02-12 NOTE — Telephone Encounter (Signed)
Re-ordered as instructed.

## 2018-02-12 NOTE — Telephone Encounter (Signed)
Radiology states order is in wrong. Please reorder as an ancillary order. It should populate under the imaging tab once ordered. Thank you. Maryjean Mornempestt S Roberts, CMA

## 2018-02-16 ENCOUNTER — Ambulatory Visit (HOSPITAL_COMMUNITY)
Admission: RE | Admit: 2018-02-16 | Discharge: 2018-02-16 | Disposition: A | Discharge status: Home / Self Care | Payer: Medicaid Other | Source: Ambulatory Visit | Attending: Physician Assistant | Admitting: Physician Assistant

## 2018-02-16 ENCOUNTER — Other Ambulatory Visit (INDEPENDENT_AMBULATORY_CARE_PROVIDER_SITE_OTHER): Payer: Self-pay | Admitting: Physician Assistant

## 2018-02-16 ENCOUNTER — Encounter (HOSPITAL_COMMUNITY): Payer: Self-pay | Admitting: Physician Assistant

## 2018-02-16 ENCOUNTER — Other Ambulatory Visit (HOSPITAL_COMMUNITY): Payer: Self-pay | Admitting: Physician Assistant

## 2018-02-16 DIAGNOSIS — J189 Pneumonia, unspecified organism: Secondary | ICD-10-CM

## 2018-02-16 DIAGNOSIS — J181 Lobar pneumonia, unspecified organism: Secondary | ICD-10-CM

## 2018-02-16 DIAGNOSIS — J90 Pleural effusion, not elsewhere classified: Secondary | ICD-10-CM | POA: Diagnosis present

## 2018-02-16 DIAGNOSIS — R918 Other nonspecific abnormal finding of lung field: Secondary | ICD-10-CM

## 2018-02-16 DIAGNOSIS — R0602 Shortness of breath: Secondary | ICD-10-CM

## 2018-02-16 HISTORY — PX: IR THORACENTESIS ASP PLEURAL SPACE W/IMG GUIDE: IMG5380

## 2018-02-16 MED ORDER — LIDOCAINE HCL (PF) 1 % IJ SOLN
INTRAMUSCULAR | Status: DC | PRN
  Administered 2018-02-16: 10 mL

## 2018-02-16 MED ORDER — LIDOCAINE HCL 1 % IJ SOLN
INTRAMUSCULAR | Status: AC
  Filled 2018-02-16: qty 20

## 2018-02-16 NOTE — Procedures (Signed)
PROCEDURE SUMMARY:  Successful US guided left thoracentesis. Yielded 700 mL of clear yellow fluid. Patient tolerated procedure well. No immediate complications. EBL < 5 mL   Post procedure chest X-ray reveals no pneumothorax  Foday [facility] S Tinley Rought PA-C 02/16/2018 3:39 PM

## 2018-02-27 ENCOUNTER — Telehealth: Payer: Self-pay | Admitting: *Deleted

## 2018-02-27 NOTE — Telephone Encounter (Signed)
Medical Assistant left message on patient's home and cell voicemail. Voicemail states to give a call back to Temp [facility] at [phone]. Patient is aware of no residual fluid being noted on the xray but a suspicious lesion was noted that will need at CT. [facility] Monday 03/09/18 at 3:15pm. Patient may have a late breakfast at 10:00 am. But may not eat anything after that. ONLY CLEAR LIQUIDS. He needs to pick up the contrast from the radiology department anytime between now and the Friday prior to the 16th

## 2018-02-27 NOTE — Telephone Encounter (Signed)
-----   Message from Loletta Specteroger David Gomez, PA-C sent at 02/16/2018  5:38 PM EST ----- Fluid drained with no residual fluid seen on chest xray. However, patient has a suspicious lesion in the left upper lung which will need a CT with contrast to further identify. I have made the order for CT, please contact patient and coordinate appointment. Thank you.

## 2018-03-03 ENCOUNTER — Encounter (INDEPENDENT_AMBULATORY_CARE_PROVIDER_SITE_OTHER): Payer: Self-pay | Admitting: Physician Assistant

## 2018-03-03 ENCOUNTER — Other Ambulatory Visit: Payer: Self-pay

## 2018-03-03 ENCOUNTER — Ambulatory Visit (INDEPENDENT_AMBULATORY_CARE_PROVIDER_SITE_OTHER): Payer: Self-pay | Admitting: Physician Assistant

## 2018-03-03 VITALS — BP 123/90 | HR 119 | Temp 97.8°F | Ht 69.0 in | Wt 148.0 lb

## 2018-03-03 DIAGNOSIS — R059 Cough, unspecified: Secondary | ICD-10-CM

## 2018-03-03 DIAGNOSIS — Z87891 Personal history of nicotine dependence: Secondary | ICD-10-CM

## 2018-03-03 DIAGNOSIS — R9389 Abnormal findings on diagnostic imaging of other specified body structures: Secondary | ICD-10-CM

## 2018-03-03 DIAGNOSIS — R05 Cough: Secondary | ICD-10-CM

## 2018-03-03 DIAGNOSIS — R61 Generalized hyperhidrosis: Secondary | ICD-10-CM

## 2018-03-03 DIAGNOSIS — R3911 Hesitancy of micturition: Secondary | ICD-10-CM

## 2018-03-03 MED ORDER — TAMSULOSIN HCL 0.4 MG PO CAPS: 0.4000 mg | ORAL_CAPSULE | Freq: Every day | ORAL | 3 refills | Status: AC

## 2018-03-03 MED ORDER — NAPROXEN 500 MG PO TABS: 500.0000 mg | ORAL_TABLET | Freq: Two times a day (BID) | ORAL | 0 refills | Status: DC

## 2018-03-03 MED ORDER — LEVOFLOXACIN 500 MG PO TABS: 500.0000 mg | ORAL_TABLET | Freq: Every day | ORAL | 0 refills | Status: DC

## 2018-03-03 NOTE — Patient Instructions (Signed)
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
## 2018-03-09 ENCOUNTER — Ambulatory Visit (HOSPITAL_COMMUNITY)
Admission: RE | Admit: 2018-03-09 | Discharge: 2018-03-09 | Disposition: A | Discharge status: Home / Self Care | Payer: Self-pay | Source: Ambulatory Visit | Attending: Physician Assistant | Admitting: Physician Assistant

## 2018-03-09 DIAGNOSIS — R0602 Shortness of breath: Secondary | ICD-10-CM

## 2018-03-09 MED ORDER — IOHEXOL 300 MG/ML  SOLN
75.0000 mL | Freq: Once | INTRAMUSCULAR | Status: AC | PRN
  Administered 2018-03-09: 75 mL via INTRAVENOUS

## 2018-03-10 ENCOUNTER — Telehealth (INDEPENDENT_AMBULATORY_CARE_PROVIDER_SITE_OTHER): Payer: Self-pay

## 2018-03-10 NOTE — Telephone Encounter (Signed)
Left voicemail notifying patient of possible tuberculosis and the need to have qantiferon TB gold. Come in at earliest convenience to have blood drawn. Call [facility] at [phone] with any questions. Maryjean Mornempestt S Roberts, CMA

## 2018-03-10 NOTE — Telephone Encounter (Signed)
-----   Message from Loletta Specteroger David Gomez, PA-C sent at 03/10/2018  2:50 PM EST ----- Possible tuberculosis. I have already ordered Quantiferon TB Gold since seven days ago. Pt should come in to have blood drawn.

## 2018-03-11 ENCOUNTER — Other Ambulatory Visit (INDEPENDENT_AMBULATORY_CARE_PROVIDER_SITE_OTHER): Payer: Self-pay

## 2018-03-11 DIAGNOSIS — R9389 Abnormal findings on diagnostic imaging of other specified body structures: Secondary | ICD-10-CM

## 2018-03-11 DIAGNOSIS — R059 Cough, unspecified: Secondary | ICD-10-CM

## 2018-03-11 DIAGNOSIS — R05 Cough: Secondary | ICD-10-CM

## 2018-03-11 DIAGNOSIS — R61 Generalized hyperhidrosis: Secondary | ICD-10-CM

## 2018-03-17 LAB — QUANTIFERON-TB GOLD PLUS
QUANTIFERON NIL VALUE: 0.15 [IU]/mL
QuantiFERON TB1 Ag Value: 1.25 IU/mL
QuantiFERON TB2 Ag Value: 1.05 IU/mL
QuantiFERON-TB Gold Plus: POSITIVE — AB

## 2018-03-19 ENCOUNTER — Telehealth (INDEPENDENT_AMBULATORY_CARE_PROVIDER_SITE_OTHER): Payer: Self-pay

## 2018-03-19 NOTE — Telephone Encounter (Signed)
Patient sister is aware that patient has tuberculosis. TB reported to health dept via fax. Sister will notify patient of active tb and have him isolate himself. She will inform others that are in close contact with him to be tested. Explained to patients sister that the Health dept should contact her in regards to patient treatment. Maryjean Mornempestt S Roberts, CMA

## 2018-03-19 NOTE — Telephone Encounter (Signed)
-----   Message from Loletta Specteroger David Gomez, PA-C sent at 03/19/2018 10:00 AM EST ----- Pt has tuberculosis and no insurance coverage. Please call health department and ask what their preferred procedure is for evaluating and treating TB patient. Please call patient and let him know to isolate himself from close contacts and to inform them to be tested also. Health department will likely test and treat close contacts.

## 2018-04-14 ENCOUNTER — Telehealth: Payer: Self-pay

## 2018-04-14 ENCOUNTER — Institutional Professional Consult (permissible substitution): Payer: Self-pay | Admitting: Pulmonary Disease

## 2018-04-14 NOTE — Telephone Encounter (Signed)
Call made to patient, patient aware he does not need to come in this afternoon. Nothing further is needed at this time.
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
## 2018-04-30 ENCOUNTER — Other Ambulatory Visit: Payer: Self-pay | Admitting: Internal Medicine

## 2018-04-30 DIAGNOSIS — A159 Respiratory tuberculosis unspecified: Secondary | ICD-10-CM

## 2018-05-07 ENCOUNTER — Ambulatory Visit: Payer: Self-pay

## 2018-05-11 ENCOUNTER — Ambulatory Visit (INDEPENDENT_AMBULATORY_CARE_PROVIDER_SITE_OTHER): Payer: Self-pay | Admitting: Primary Care

## 2018-05-21 ENCOUNTER — Ambulatory Visit: Payer: Disability Insurance | Attending: Internal Medicine

## 2018-05-21 DIAGNOSIS — A159 Respiratory tuberculosis unspecified: Secondary | ICD-10-CM | POA: Diagnosis present

## 2018-05-21 MED ORDER — ALBUTEROL SULFATE (2.5 MG/3ML) 0.083% IN NEBU
2.5000 mg | INHALATION_SOLUTION | Freq: Once | RESPIRATORY_TRACT | Status: AC
  Administered 2018-05-21: 2.5 mg via RESPIRATORY_TRACT
  Filled 2018-05-21: qty 3

## 2018-06-12 ENCOUNTER — Other Ambulatory Visit: Payer: Self-pay

## 2018-06-12 ENCOUNTER — Other Ambulatory Visit: Payer: Self-pay | Admitting: Internal Medicine

## 2018-06-12 ENCOUNTER — Ambulatory Visit
Admission: RE | Admit: 2018-06-12 | Discharge: 2018-06-12 | Disposition: A | Discharge status: Home / Self Care | Payer: No Typology Code available for payment source | Source: Ambulatory Visit | Attending: Internal Medicine | Admitting: Internal Medicine

## 2018-06-12 DIAGNOSIS — Z111 Encounter for screening for respiratory tuberculosis: Secondary | ICD-10-CM

## 2018-10-12 ENCOUNTER — Ambulatory Visit
Admission: RE | Admit: 2018-10-12 | Discharge: 2018-10-12 | Disposition: A | Discharge status: Home / Self Care | Payer: No Typology Code available for payment source | Source: Ambulatory Visit | Attending: Internal Medicine | Admitting: Internal Medicine

## 2018-10-12 ENCOUNTER — Other Ambulatory Visit: Payer: Self-pay | Admitting: Internal Medicine

## 2018-10-12 DIAGNOSIS — Z09 Encounter for follow-up examination after completed treatment for conditions other than malignant neoplasm: Secondary | ICD-10-CM

## 2019-07-01 ENCOUNTER — Ambulatory Visit (INDEPENDENT_AMBULATORY_CARE_PROVIDER_SITE_OTHER): Payer: No Typology Code available for payment source | Admitting: Primary Care

## 2019-11-19 ENCOUNTER — Ambulatory Visit (HOSPITAL_COMMUNITY)
Admission: EM | Admit: 2019-11-19 | Discharge: 2019-11-19 | Disposition: A | Discharge status: Home / Self Care | Payer: Medicaid Other | Attending: Physician Assistant | Admitting: Physician Assistant

## 2019-11-19 ENCOUNTER — Other Ambulatory Visit: Payer: Self-pay

## 2019-11-19 ENCOUNTER — Ambulatory Visit (INDEPENDENT_AMBULATORY_CARE_PROVIDER_SITE_OTHER): Payer: Medicaid Other

## 2019-11-19 DIAGNOSIS — M1990 Unspecified osteoarthritis, unspecified site: Secondary | ICD-10-CM | POA: Diagnosis not present

## 2019-11-19 DIAGNOSIS — I10 Essential (primary) hypertension: Secondary | ICD-10-CM | POA: Diagnosis not present

## 2019-11-19 DIAGNOSIS — Z79899 Other long term (current) drug therapy: Secondary | ICD-10-CM | POA: Diagnosis not present

## 2019-11-19 DIAGNOSIS — R110 Nausea: Secondary | ICD-10-CM | POA: Insufficient documentation

## 2019-11-19 DIAGNOSIS — U071 COVID-19: Secondary | ICD-10-CM | POA: Diagnosis not present

## 2019-11-19 DIAGNOSIS — Z8249 Family history of ischemic heart disease and other diseases of the circulatory system: Secondary | ICD-10-CM | POA: Insufficient documentation

## 2019-11-19 DIAGNOSIS — R509 Fever, unspecified: Secondary | ICD-10-CM | POA: Diagnosis not present

## 2019-11-19 DIAGNOSIS — Z791 Long term (current) use of non-steroidal anti-inflammatories (NSAID): Secondary | ICD-10-CM | POA: Insufficient documentation

## 2019-11-19 DIAGNOSIS — J45909 Unspecified asthma, uncomplicated: Secondary | ICD-10-CM | POA: Insufficient documentation

## 2019-11-19 DIAGNOSIS — Z87891 Personal history of nicotine dependence: Secondary | ICD-10-CM | POA: Insufficient documentation

## 2019-11-19 DIAGNOSIS — J90 Pleural effusion, not elsewhere classified: Secondary | ICD-10-CM | POA: Insufficient documentation

## 2019-11-19 DIAGNOSIS — R05 Cough: Secondary | ICD-10-CM | POA: Diagnosis not present

## 2019-11-19 LAB — CBC WITH DIFFERENTIAL/PLATELET
Abs Immature Granulocytes: 0.02 10*3/uL (ref 0.00–0.07)
Basophils Absolute: 0 10*3/uL (ref 0.0–0.1)
Basophils Relative: 0 %
Eosinophils Absolute: 0.2 10*3/uL (ref 0.0–0.5)
Eosinophils Relative: 2 %
HCT: 47.5 % (ref 39.0–52.0)
Hemoglobin: 16.2 g/dL (ref 13.0–17.0)
Immature Granulocytes: 0 %
Lymphocytes Relative: 12 %
Lymphs Abs: 0.9 10*3/uL (ref 0.7–4.0)
MCH: 32 pg (ref 26.0–34.0)
MCHC: 34.1 g/dL (ref 30.0–36.0)
MCV: 93.7 fL (ref 80.0–100.0)
Monocytes Absolute: 1 10*3/uL (ref 0.1–1.0)
Monocytes Relative: 15 %
Neutro Abs: 5 10*3/uL (ref 1.7–7.7)
Neutrophils Relative %: 71 %
Platelets: 145 10*3/uL — ABNORMAL LOW (ref 150–400)
RBC: 5.07 MIL/uL (ref 4.22–5.81)
RDW: 13.4 % (ref 11.5–15.5)
WBC: 7.1 10*3/uL (ref 4.0–10.5)
nRBC: 0 % (ref 0.0–0.2)

## 2019-11-19 MED ORDER — ONDANSETRON 4 MG PO TBDP
ORAL_TABLET | ORAL | Status: AC
  Filled 2019-11-19: qty 1

## 2019-11-19 MED ORDER — ACETAMINOPHEN 325 MG PO TABS
650.0000 mg | ORAL_TABLET | Freq: Once | ORAL | Status: AC
  Administered 2019-11-19: 650 mg via ORAL

## 2019-11-19 MED ORDER — ONDANSETRON 4 MG PO TBDP
4.0000 mg | ORAL_TABLET | Freq: Once | ORAL | Status: AC
  Administered 2019-11-19: 4 mg via ORAL

## 2019-11-19 MED ORDER — ACETAMINOPHEN 325 MG PO TABS
ORAL_TABLET | ORAL | Status: AC
  Filled 2019-11-19: qty 2

## 2019-11-19 MED ORDER — ACETAMINOPHEN 500 MG PO TABS: 500.0000 mg | ORAL_TABLET | Freq: Four times a day (QID) | ORAL | 0 refills | Status: DC | PRN

## 2019-11-19 MED ORDER — ONDANSETRON HCL 4 MG PO TABS: 4.0000 mg | ORAL_TABLET | Freq: Three times a day (TID) | ORAL | 0 refills | Status: DC | PRN

## 2019-11-19 NOTE — ED Notes (Signed)
Called pt, no answer.

## 2019-11-19 NOTE — ED Triage Notes (Signed)
Pt presents with nausea and headache, loss of taste and smell. States had a temp of 101 this am.   Denies sore throat

## 2019-11-19 NOTE — Discharge Instructions (Addendum)
Your xray is stable today which is reassuring.  Zofran every 8 hours as needed for nausea or vomiting.   I am concerned about covid-19.  Self isolate until covid results are back and negative.  Will notify you by phone of any positive findings. Your negative results will be sent through your MyChart.     If worsening of symptoms please return or go to the ER.
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
## 2019-11-20 LAB — SARS CORONAVIRUS 2 (TAT 6-24 HRS): SARS Coronavirus 2: POSITIVE — AB

## 2019-11-21 ENCOUNTER — Telehealth: Payer: Self-pay | Admitting: Adult Health

## 2019-11-21 NOTE — Telephone Encounter (Signed)
Called and LMOM regarding monoclonal antibody treatment for COVID 19 given to those who are at risk for complications and/or hospitalization of the virus.  Patient meets criteria based on: chronic lung disease  Call back number given: [phone]  My chart message: unable to send  Lillard Anes, NP

## 2019-11-24 IMAGING — CR DG ANKLE 2V *R*
2 series · 2 of 2 positions shown · non-contrast
Comparison: None.

CLINICAL DATA: RIGHT foot/ankle injury, pain for 2 months.

EXAM:
RIGHT ANKLE - 2 VIEW

[ankle ap]
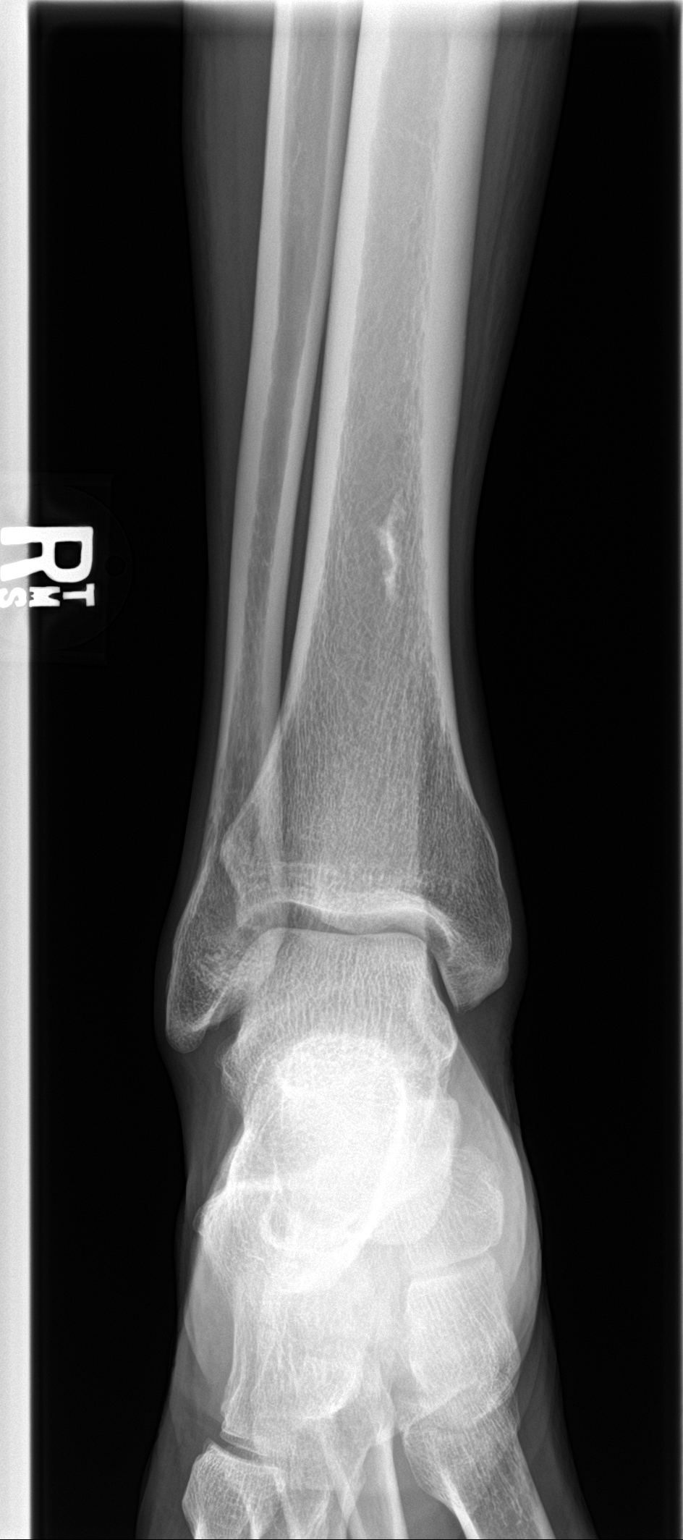

[ankle lat]
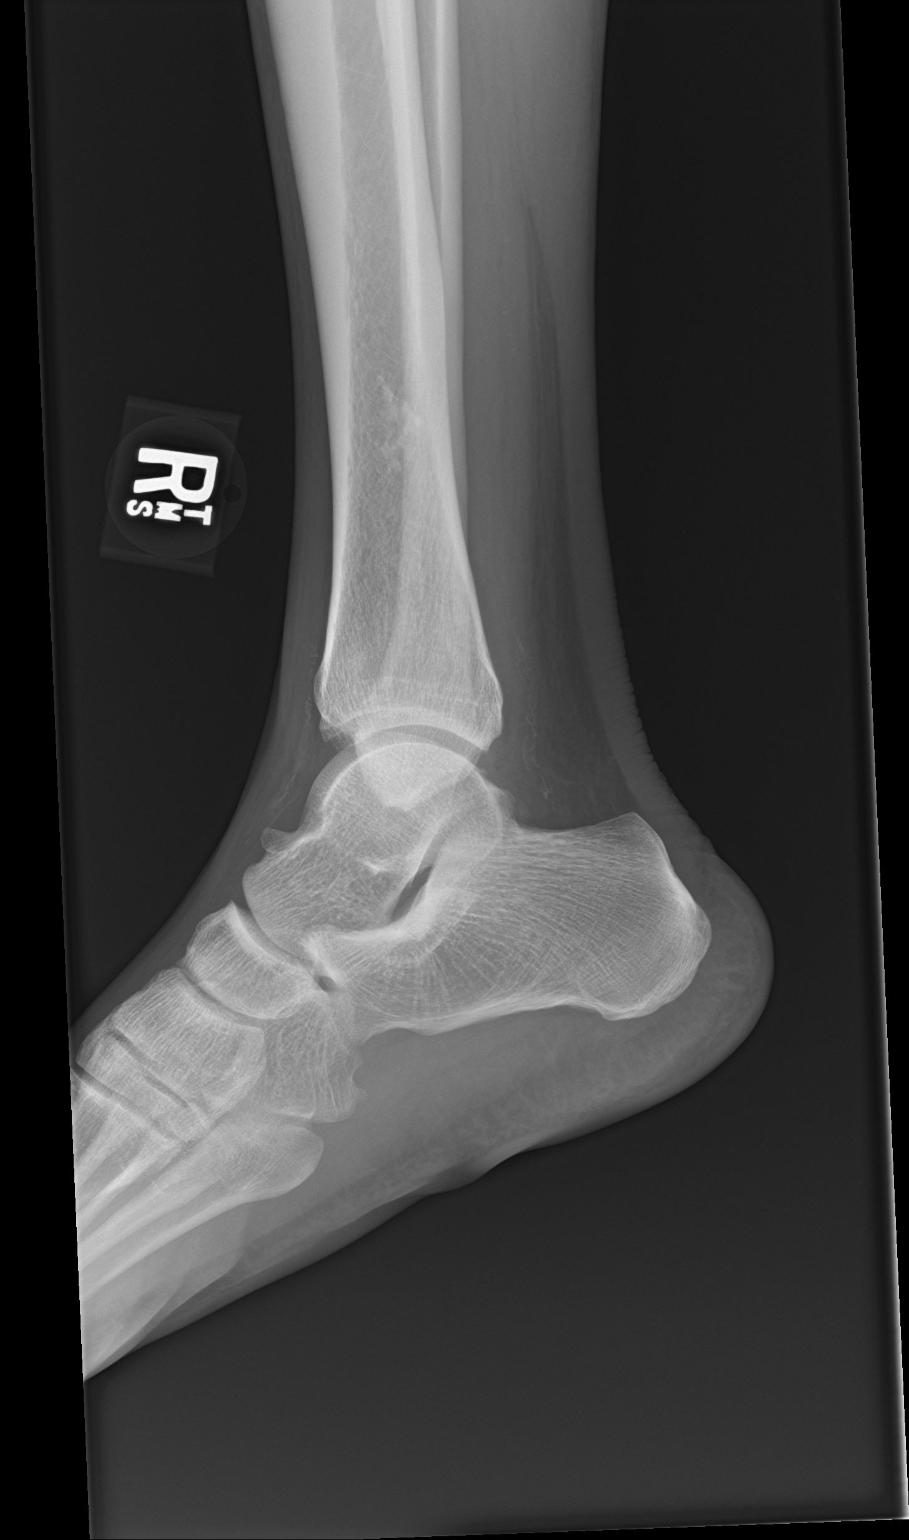

[2 of 2 positions shown; findings below may reference images not displayed]

FINDINGS: Osseous alignment is normal. Ankle mortise is symmetric. No fracture
line or displaced fracture fragment. No acute or suspicious osseous
lesion. No degenerative change at the LEFT ankle. Visualized
portions of the hindfoot and midfoot appear intact and normally
aligned.

Vascular calcifications. Soft tissues about the RIGHT ankle are
otherwise unremarkable.
IMPRESSION: No acute findings.  Vascular calcifications.

## 2019-11-24 IMAGING — CR DG CHEST 2V
2 series · 2 of 2 positions shown · non-contrast
Comparison: Chest x-ray dated 06/20/2016.

CLINICAL DATA: Productive cough with SOB x 2 years, HTN, smoker - 1
pack per week x age 14, pt shielded

EXAM:
CHEST - 2 VIEW

[chest pa]
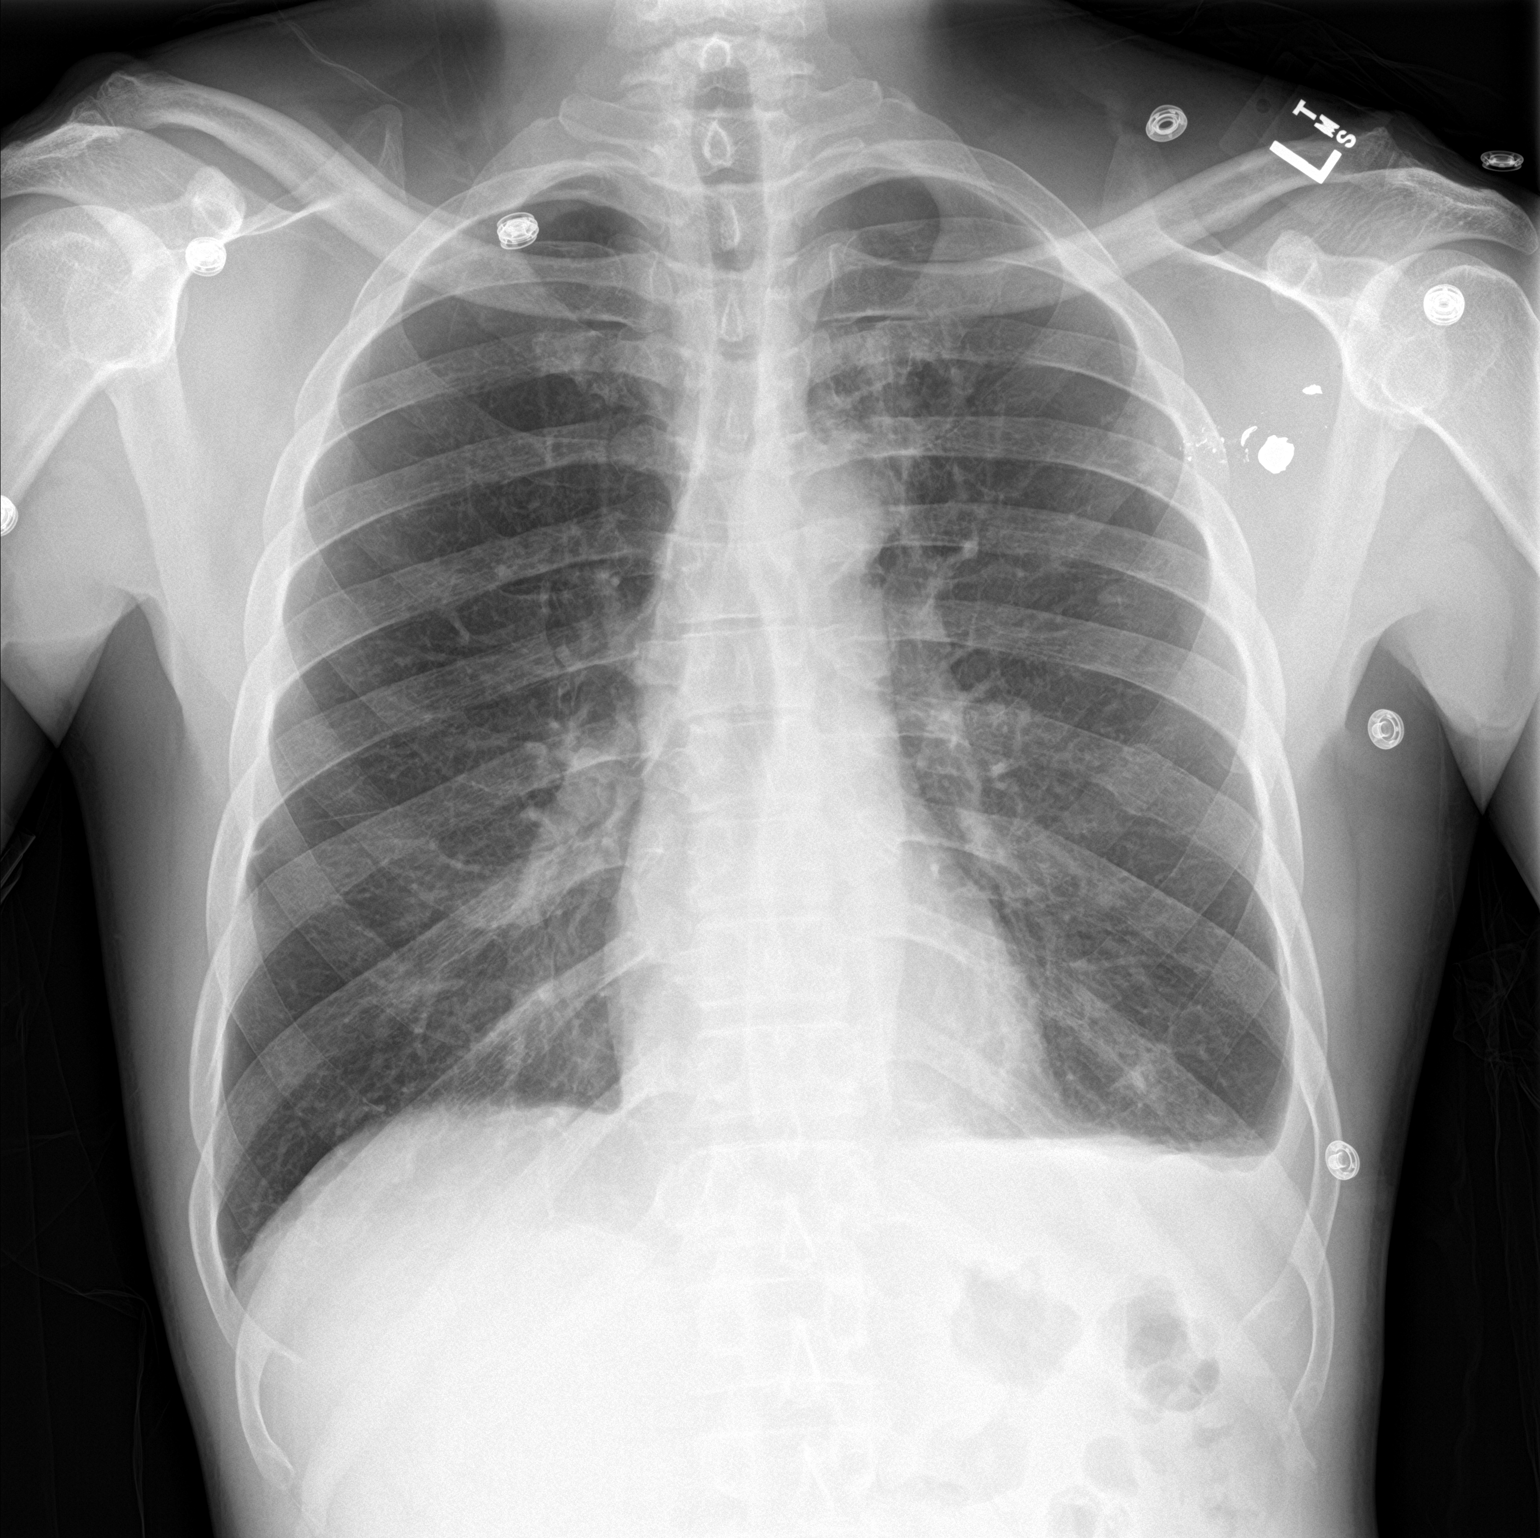

[chest lat]
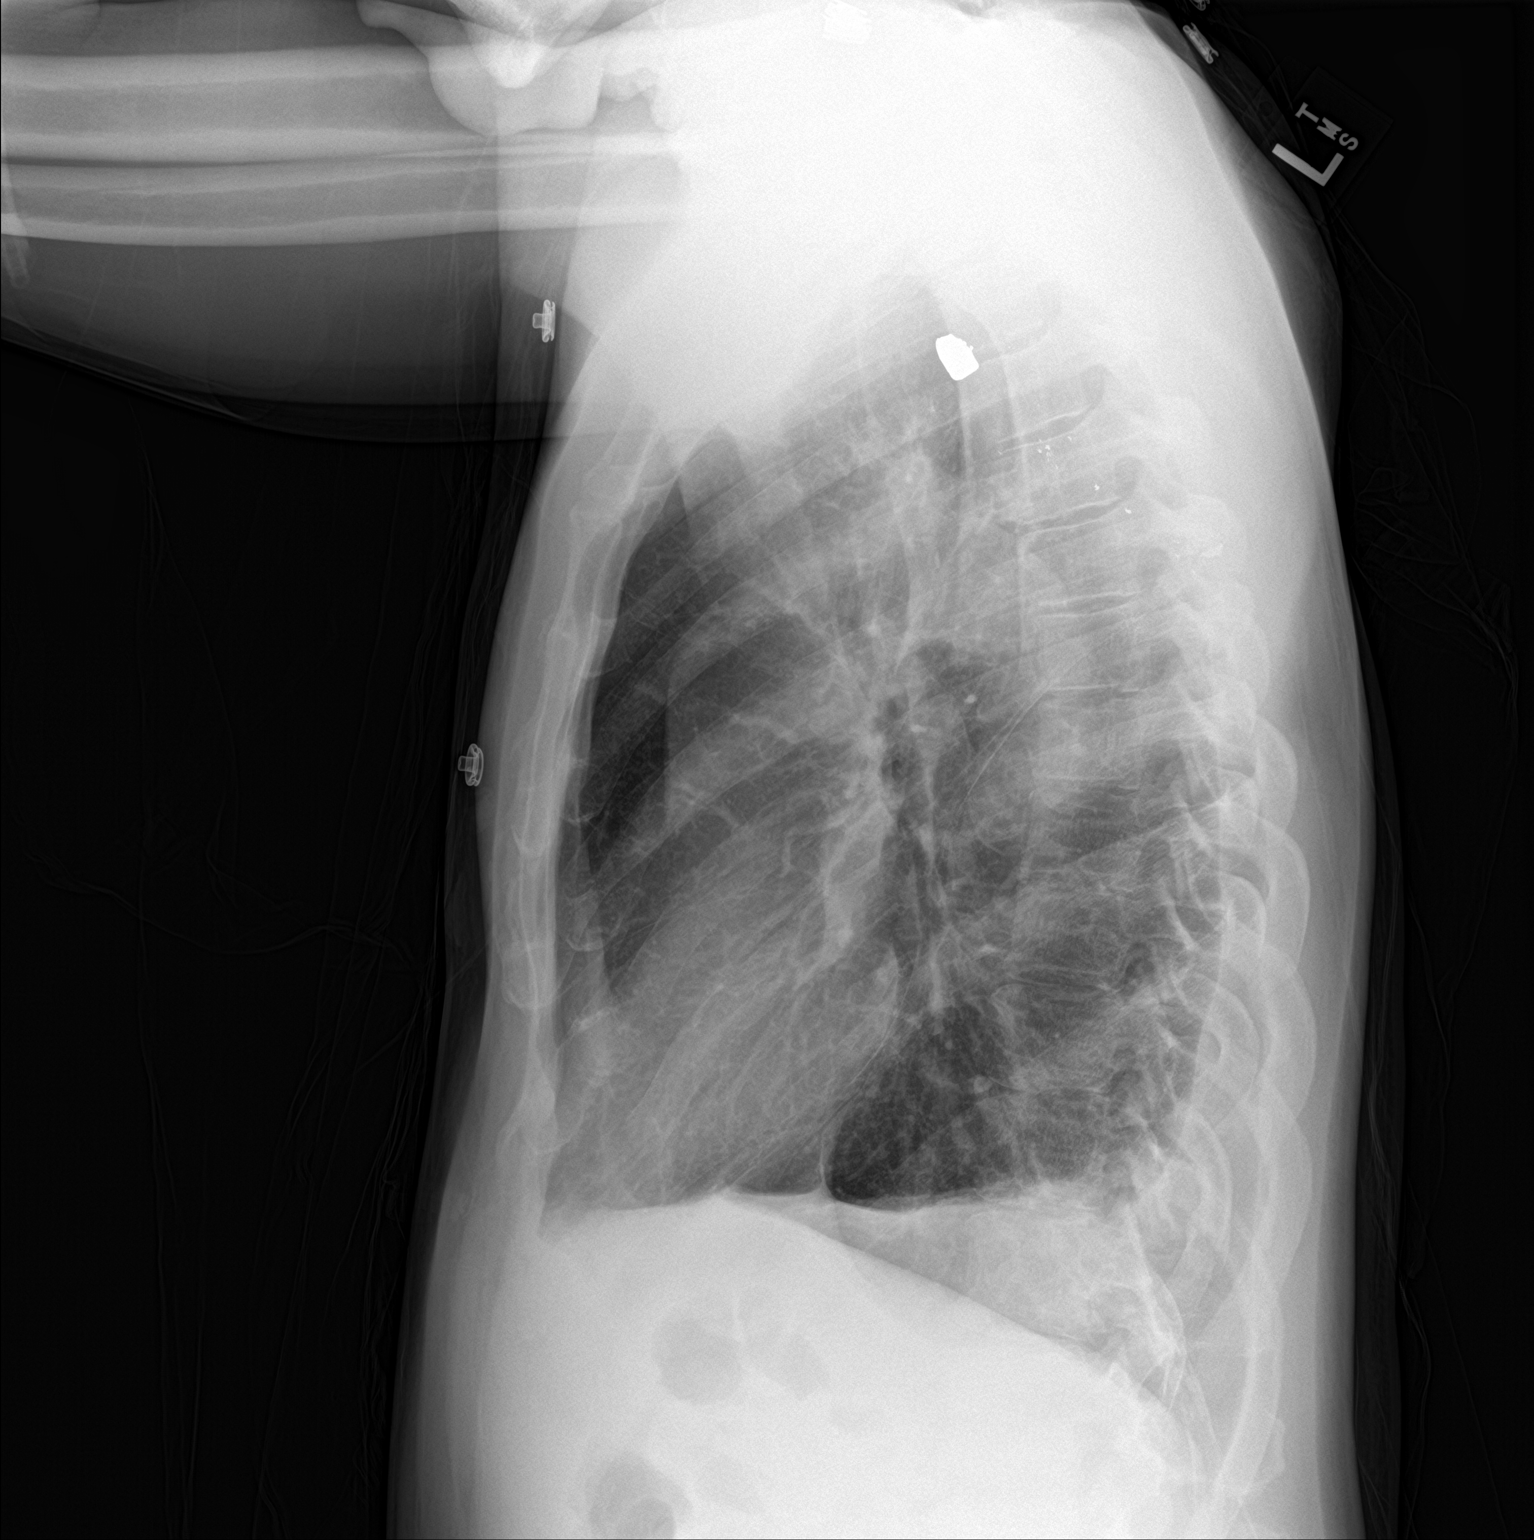

[2 of 2 positions shown; findings below may reference images not displayed]

FINDINGS: Heart size and mediastinal contours are within normal limits. Small
LEFT pleural effusion. Lungs are otherwise clear. No acute or
suspicious osseous finding. Old fracture of the LEFT eleventh rib.
IMPRESSION: 1. Small LEFT pleural effusion. Lungs otherwise clear. Cannot
exclude underlying pneumonia.
2. Old fracture of the LEFT eleventh rib, with at least partial
nonunion.

## 2019-11-24 IMAGING — CR DG HIP (WITH OR WITHOUT PELVIS) 2-3V*L*
3 series · 3 of 3 positions shown · non-contrast
Comparison: None.

CLINICAL DATA: Left anterior hip pain now has moved to posterior
hip x 2 months, no known injury, HTN, smoker - 1 pack per week x age
14, pt shielded

EXAM:
DG HIP (WITH OR WITHOUT PELVIS) 2-3V LEFT

[pelvis ap]
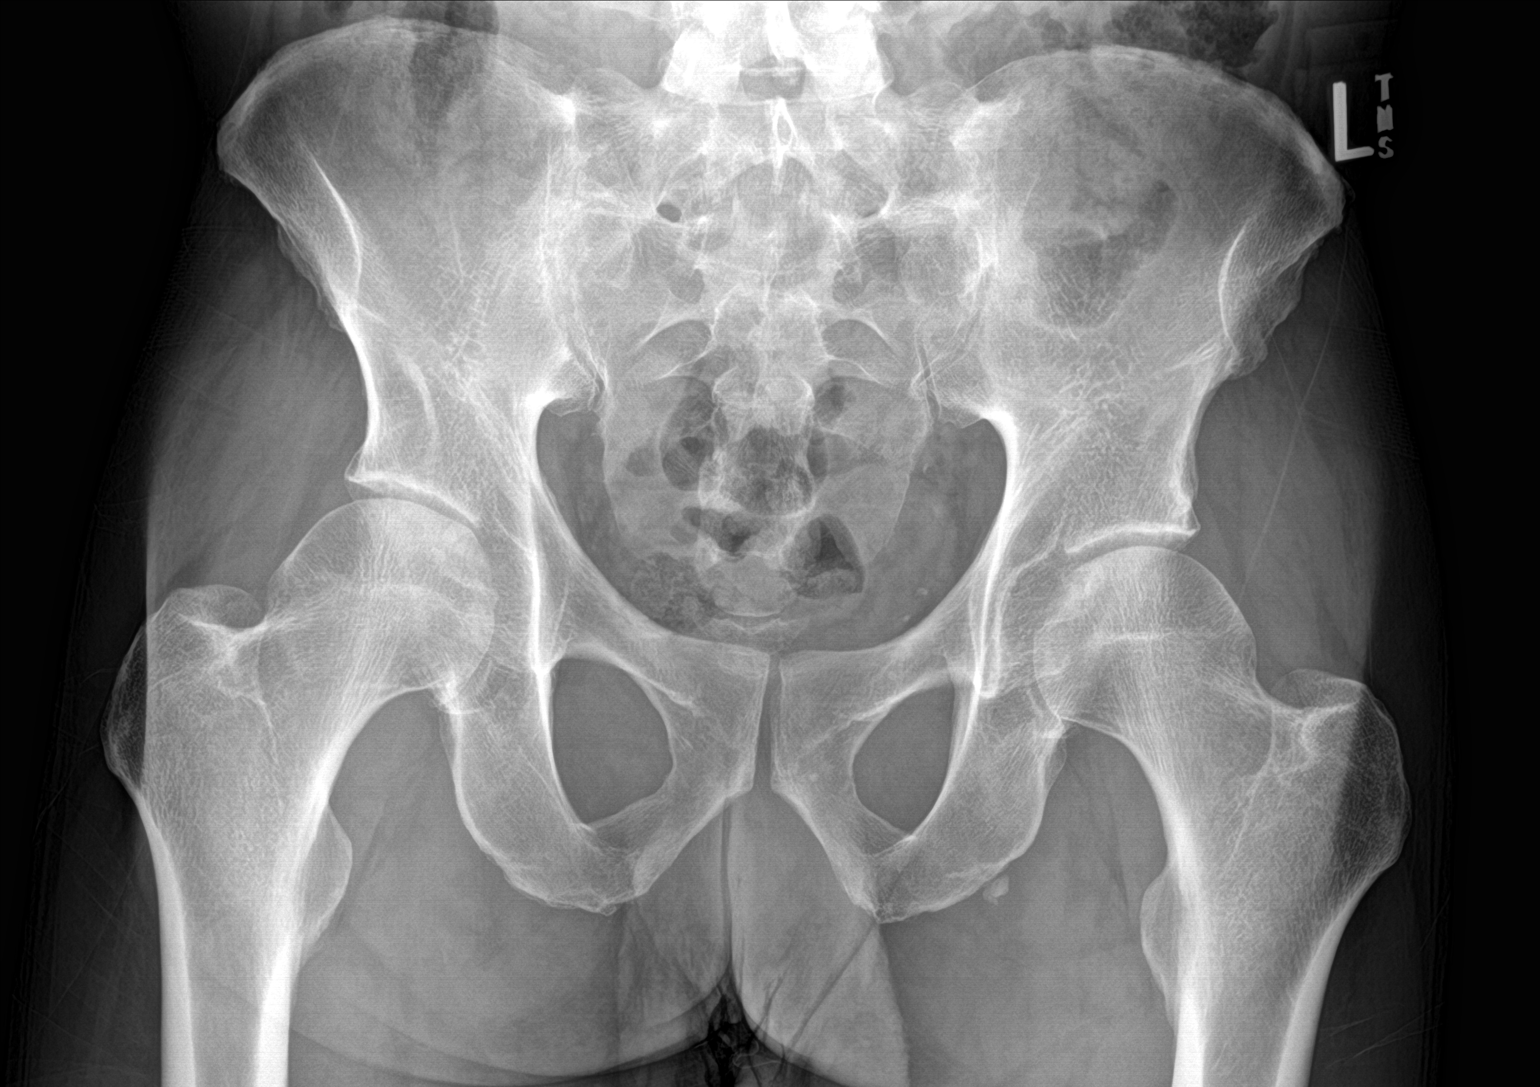

[hip ap]
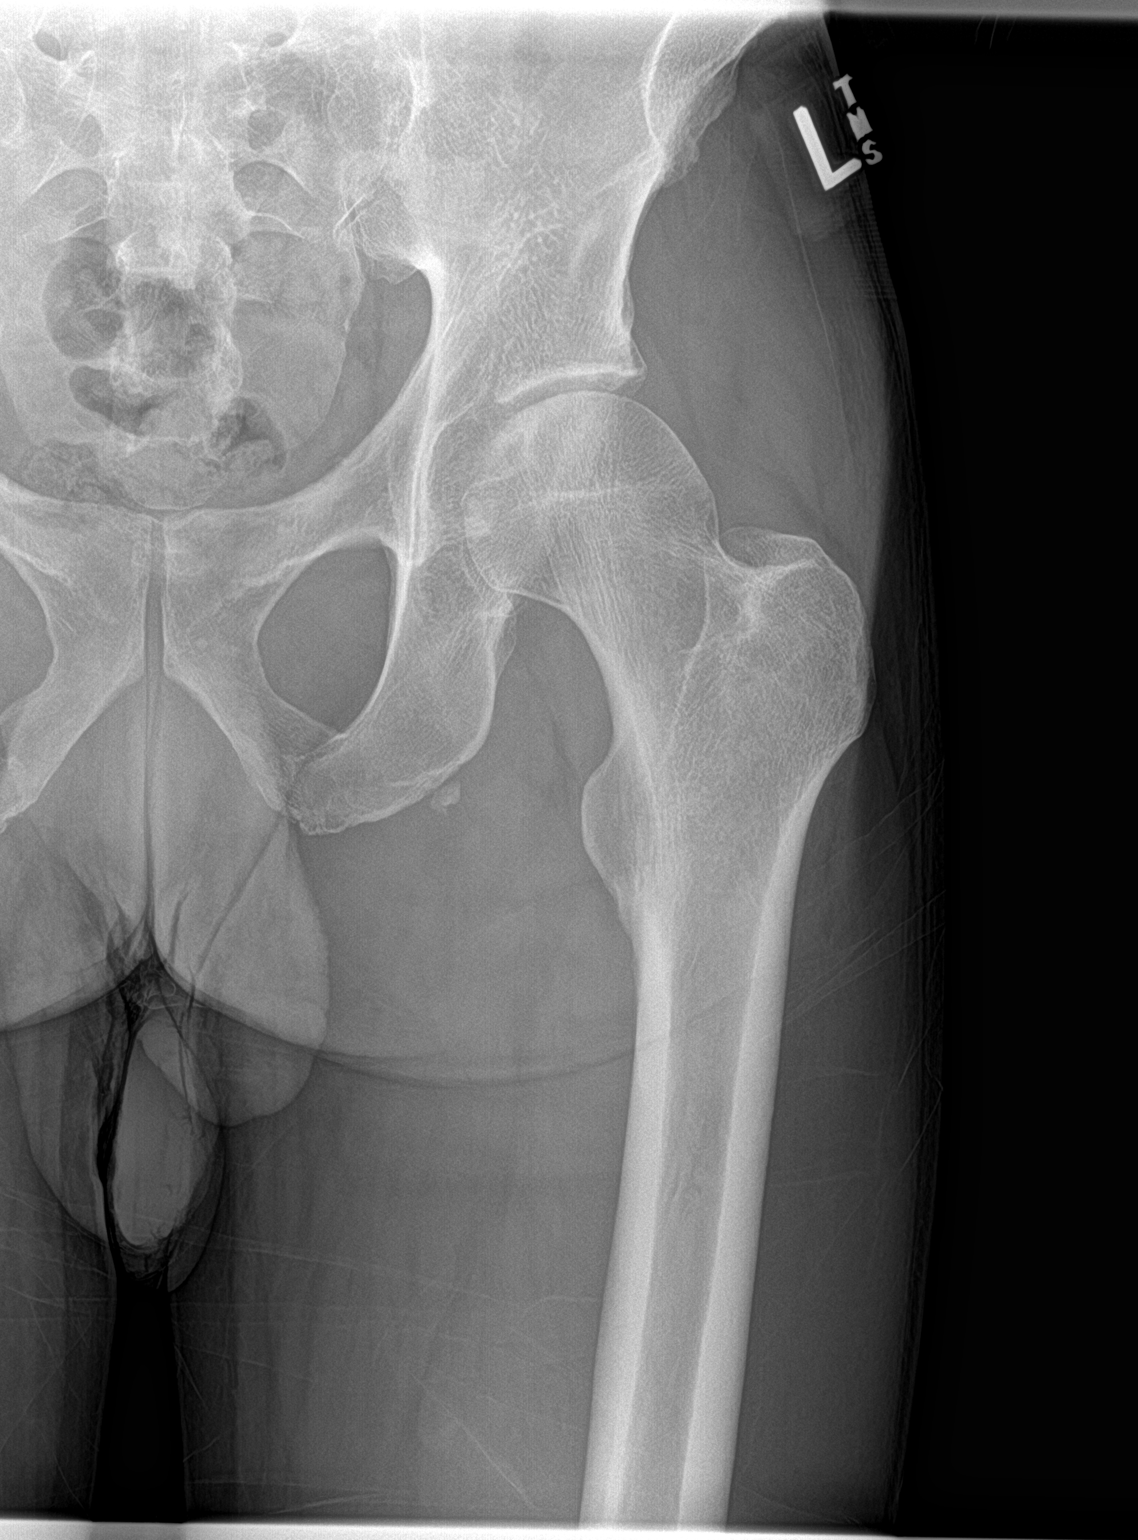

[hip lat]
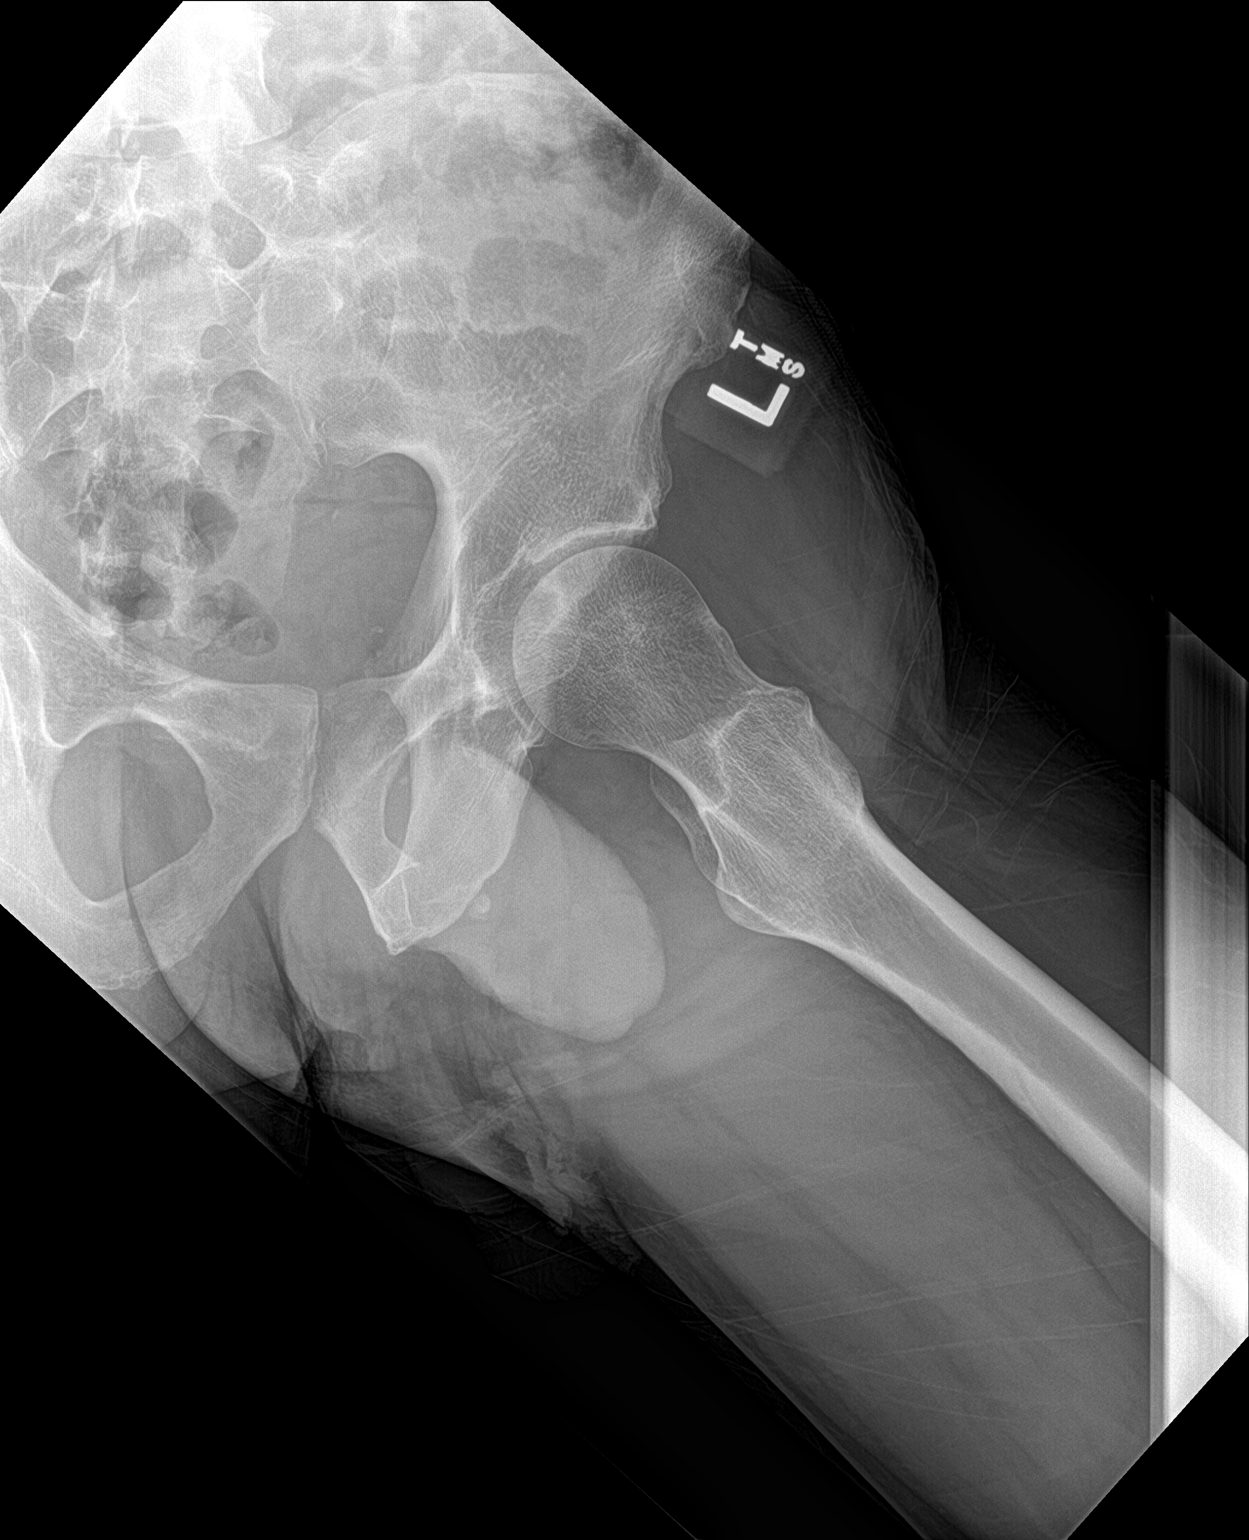

[3 of 3 positions shown; findings below may reference images not displayed]

FINDINGS: There is no evidence of hip fracture or dislocation. There is no
evidence of arthropathy or other focal bone abnormality.
IMPRESSION: Negative.

## 2020-05-23 ENCOUNTER — Other Ambulatory Visit: Payer: Self-pay | Admitting: Family Medicine

## 2020-05-23 ENCOUNTER — Ambulatory Visit
Admission: RE | Admit: 2020-05-23 | Discharge: 2020-05-23 | Disposition: A | Discharge status: Home / Self Care | Payer: Medicaid Other | Source: Ambulatory Visit | Attending: Family Medicine | Admitting: Family Medicine

## 2020-05-23 DIAGNOSIS — R0602 Shortness of breath: Secondary | ICD-10-CM

## 2020-05-26 ENCOUNTER — Emergency Department (HOSPITAL_COMMUNITY): Payer: Medicaid Other

## 2020-05-26 ENCOUNTER — Emergency Department (HOSPITAL_COMMUNITY)
Admission: EM | Admit: 2020-05-26 | Discharge: 2020-05-26 | Disposition: A | Discharge status: Home / Self Care | Payer: Medicaid Other | Attending: Emergency Medicine | Admitting: Emergency Medicine

## 2020-05-26 ENCOUNTER — Other Ambulatory Visit: Payer: Self-pay

## 2020-05-26 DIAGNOSIS — I10 Essential (primary) hypertension: Secondary | ICD-10-CM | POA: Diagnosis not present

## 2020-05-26 DIAGNOSIS — Z87891 Personal history of nicotine dependence: Secondary | ICD-10-CM | POA: Diagnosis not present

## 2020-05-26 DIAGNOSIS — J90 Pleural effusion, not elsewhere classified: Secondary | ICD-10-CM | POA: Diagnosis not present

## 2020-05-26 DIAGNOSIS — R0602 Shortness of breath: Secondary | ICD-10-CM | POA: Diagnosis present

## 2020-05-26 DIAGNOSIS — Z20822 Contact with and (suspected) exposure to covid-19: Secondary | ICD-10-CM | POA: Insufficient documentation

## 2020-05-26 DIAGNOSIS — J441 Chronic obstructive pulmonary disease with (acute) exacerbation: Secondary | ICD-10-CM | POA: Insufficient documentation

## 2020-05-26 LAB — BASIC METABOLIC PANEL
Anion gap: 10 (ref 5–15)
BUN: 8 mg/dL (ref 6–20)
CO2: 22 mmol/L (ref 22–32)
Calcium: 9 mg/dL (ref 8.9–10.3)
Chloride: 106 mmol/L (ref 98–111)
Creatinine, Ser: 0.84 mg/dL (ref 0.61–1.24)
GFR, Estimated: >60 mL/min (ref >60)
Glucose, Bld: 65 mg/dL — ABNORMAL LOW (ref 70–99)
Potassium: 4 mmol/L (ref 3.5–5.1)
Sodium: 138 mmol/L (ref 135–145)

## 2020-05-26 LAB — CBG MONITORING, ED: Glucose-Capillary: 88 mg/dL (ref 70–99)

## 2020-05-26 LAB — CBC
HCT: 44.9 % (ref 39.0–52.0)
Hemoglobin: 15.1 g/dL (ref 13.0–17.0)
MCH: 31.6 pg (ref 26.0–34.0)
MCHC: 33.6 g/dL (ref 30.0–36.0)
MCV: 93.9 fL (ref 80.0–100.0)
Platelets: 171 10*3/uL (ref 150–400)
RBC: 4.78 MIL/uL (ref 4.22–5.81)
RDW: 15 % (ref 11.5–15.5)
WBC: 4.1 10*3/uL (ref 4.0–10.5)
nRBC: 0 % (ref 0.0–0.2)

## 2020-05-26 LAB — SARS CORONAVIRUS 2 (TAT 6-24 HRS): SARS Coronavirus 2: NEGATIVE

## 2020-05-26 MED ORDER — ALBUTEROL SULFATE HFA 108 (90 BASE) MCG/ACT IN AERS
8.0000 | INHALATION_SPRAY | Freq: Once | RESPIRATORY_TRACT | Status: AC
  Administered 2020-05-26: 8 via RESPIRATORY_TRACT
  Filled 2020-05-26: qty 6.7

## 2020-05-26 MED ORDER — PREDNISONE 20 MG PO TABS: ORAL_TABLET | ORAL | 0 refills | Status: AC

## 2020-05-26 MED ORDER — PREDNISONE 20 MG PO TABS
60.0000 mg | ORAL_TABLET | Freq: Once | ORAL | Status: AC
  Administered 2020-05-26: 60 mg via ORAL
  Filled 2020-05-26: qty 3

## 2020-05-26 NOTE — ED Notes (Signed)
Ambulated pt in room, pulse stayed in between 98-101 and O2 was between 97-100 while ambulating.

## 2020-05-26 NOTE — Discharge Instructions (Addendum)
The radiology department will call you for an appointment on 05/29/20  Use the albuterol inhaler 2 puffs every 4 hours as needed for cough or shortness of breath.  If you need it significantly more than this then return to the ER or call 911.  You were given your first dose of steroids today, start the rest of the prescription tomorrow.
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
## 2020-05-26 NOTE — ED Triage Notes (Signed)
Pt here via pov with reports of increased shob. Pt was supposed to have fluid drained from his lungs yesterday.

## 2020-06-05 ENCOUNTER — Ambulatory Visit (INDEPENDENT_AMBULATORY_CARE_PROVIDER_SITE_OTHER): Payer: Medicaid Other | Admitting: Primary Care

## 2020-06-05 ENCOUNTER — Other Ambulatory Visit: Payer: Self-pay

## 2020-06-05 ENCOUNTER — Encounter (INDEPENDENT_AMBULATORY_CARE_PROVIDER_SITE_OTHER): Payer: Self-pay | Admitting: Primary Care

## 2020-06-05 VITALS — BP 109/75 | HR 117 | Temp 97.5°F | Ht 69.0 in | Wt 154.8 lb

## 2020-06-05 DIAGNOSIS — J90 Pleural effusion, not elsewhere classified: Secondary | ICD-10-CM

## 2020-06-05 DIAGNOSIS — R059 Cough, unspecified: Secondary | ICD-10-CM

## 2020-06-05 DIAGNOSIS — F17200 Nicotine dependence, unspecified, uncomplicated: Secondary | ICD-10-CM | POA: Diagnosis not present

## 2020-06-05 DIAGNOSIS — F1029 Alcohol dependence with unspecified alcohol-induced disorder: Secondary | ICD-10-CM | POA: Diagnosis not present

## 2020-06-05 DIAGNOSIS — Z09 Encounter for follow-up examination after completed treatment for conditions other than malignant neoplasm: Secondary | ICD-10-CM

## 2020-06-05 MED ORDER — ALBUTEROL SULFATE HFA 108 (90 BASE) MCG/ACT IN AERS: 2.0000 | INHALATION_SPRAY | RESPIRATORY_TRACT | 2 refills | Status: AC | PRN

## 2020-06-05 NOTE — Progress Notes (Signed)
Chest pain when he coughs  Right side pain

## 2020-06-05 NOTE — Progress Notes (Signed)
   HPI  Mr.  Patrick Estrada is a 60 y.o.male presents for follow up from ED on  05/26/20 for shortness of breath , chest pain wants to have fluid off of lungs drained. Tried to inform patient this is not the place to perform that procedure.   Past Medical History:  Diagnosis Date  . Arthritis   . Asthma   . Hypertension 10/12/2011     Allergies  Allergen Reactions  . Morphine And Related Itching and Rash    burning      Current Outpatient Medications on File Prior to Visit  Medication Sig Dispense Refill  . predniSONE (DELTASONE) 20 MG tablet 2 tabs po daily x 4 days 8 tablet 0  . tamsulosin (FLOMAX) 0.4 MG CAPS capsule Take 1 capsule (0.4 mg total) by mouth daily. 30 capsule 3   No current facility-administered medications on file prior to visit.    ROS Review of Systems  HENT: Positive for congestion and sore throat.   Respiratory: Positive for shortness of breath.   Cardiovascular: Positive for chest pain.  All other systems reviewed and are negative.   Physical Exam: Filed Weights   06/05/20 1410  Weight: 154 lb 12.8 oz (70.2 kg)   BP 109/75 (BP Location: Right Arm, Patient Position: Sitting, Cuff Size: Normal)   Pulse (!) 117   Temp (!) 97.5 F (36.4 C) (Temporal)   Ht 5\' 9"  (1.753 m)   Wt 154 lb 12.8 oz (70.2 kg)   SpO2 95%   BMI 22.86 kg/m  General Appearance: Well nourished, thin frame in no apparent distress. Eyes: PERRLA, EOMs,  Sinuses: No Frontal/maxillary tenderness ENT/Mouth: Ext aud canals clear,poor dentition  Hearing normal.  Neck: Supple, thyroid normal.  Respiratory: Respiratory effort normal, BS equal bilaterally with  wheezing present Cardio: RRR with no MRGs. Brisk peripheral pulses without edema.  Abdomen: Soft, + BS.  Non tender, no guarding, rebound, hernias, masses. Lymphatics: Non tender without lymphadenopathy.  Musculoskeletal: Full ROM, 5/5 strength, normal gait.  Skin: Warm, dry without rashes, lesions, ecchymosis.  Neuro: Cranial  nerves intact.  Psych: Awake and oriented X 3,  Judgment questionable    Patrick Estrada was seen today for hospitalization follow-up.  Diagnoses and all orders for this visit:  Hospital discharge follow-up Seen in ED for plural effusion and COPD exacerbation referral placed to pulmonary too tx with albuterol and steroids  -    Alcohol dependence with unspecified alcohol-induced disorder (HCC) Unable to have a complete conversation before he ask again was I going to remove the fluid explained no. Asked when was the last time he had a drink 2 days ago. Risk of cirrhosis of the liver with continued alcohol use   Tobacco dependence Each visit discuss cessation and risk factors smoking with smoking   Pleural effusion on left -     Ambulatory referral to Pulmonology  Cough -     albuterol (VENTOLIN HFA) 108 (90 Base) MCG/ACT inhaler; Inhale 2 puffs into the lungs every 4 (four) hours as needed for wheezing or shortness of breath.    Fredrik Cove, NP 2:17 PM

## 2020-06-05 NOTE — Patient Instructions (Signed)
Pulmonary Embolism  A pulmonary embolism (PE) is a sudden blockage or decrease of blood flow in one or both lungs that happens when a clot travels into the arteries of the lung (pulmonary arteries). Most blockages come from a blood clot that forms in the vein of a leg or arm (deep vein thrombosis, DVT) and travels to the lungs. A clot is blood that has thickened into a gel or solid. PE is a dangerous and life-threatening condition that needs to be treated right away. What are the causes? This condition is usually caused by a blood clot that forms in a vein and moves to the lungs. In rare cases, it may be caused by air, fat, part of a tumor, or other tissue that moves through the veins and into the lungs. What increases the risk? The following factors may make you more likely to develop this condition:  Experiencing a traumatic injury, such as breaking a hip or leg.  Having: ? A spinal cord injury. ? Major surgery, especially hip or knee replacement, or surgery on parts of the nervous system or on the abdomen. ? A stroke. ? A blood clotting disease. ? Long-term (chronic) lung or heart disease. ? Cancer, especially if you are being treated with chemotherapy. ? A central venous catheter.  Taking medicines that contain estrogen. These include birth control pills and hormone replacement therapy.  Being: ? Pregnant. ? In the period of time after your baby is delivered (postpartum). ? Older than age 60. ? Overweight. ? A smoker, especially if you have other risks. ? Not very active (sedentary), not being able to move at all, or spending long periods sitting, such as travel over 6 hours. You are also at a greater risk if you have a leg in a cast or splint. What are the signs or symptoms? Symptoms of this condition usually start suddenly and include:  Shortness of breath during activity or at rest.  Coughing, coughing up blood, or coughing up bloody mucus.  Chest pain, back pain, or  shoulder blade pain that gets worse with deep breaths.  Rapid or irregular heartbeat.  Feeling light-headed or dizzy, or fainting.  Feeling anxious.  Pain and swelling in a leg. This is a symptom of DVT, which can lead to PE. How is this diagnosed? This condition may be diagnosed based on your medical history, a physical exam, and tests. Tests may include:  Blood tests.  An ECG (electrocardiogram) of the heart.  A CT pulmonary angiogram. This test checks blood flow in and around your lungs.  A ventilation-perfusion scan, also called a lung VQ scan. This test measures air flow and blood flow to the lungs.  An ultrasound to check for a DVT. How is this treated? Treatment for this condition depends on many factors, such as the cause of your PE, your risk for bleeding or developing more clots, and other medical conditions you may have. Treatment aims to stop blood clots from forming or growing larger. In some cases, treatment may be aimed at breaking apart or removing the blood clot. Treatment may include:  Medicines, such as: ? Blood thinning medicines, also called anticoagulants, to stop clots from forming and growing. ? Medicines that break apart clots (thrombolytics).  Procedures, such as: ? Using a flexible tube to remove a blood clot (embolectomy) or to deliver medicine to destroy it (catheter-directed thrombolysis). ? Surgery to remove the clot (surgical embolectomy). This is rare. You may need a combination of immediate, long-term, and extended   treatments. Your treatment may continue for several months (maintenance therapy) or longer depending on your medical conditions. You and your health care provider will work together to choose the treatment program that is best for you. Follow these instructions at home: Medicines  Take over-the-counter and prescription medicines only as told by your health care provider.  If you are taking blood thinners: ? Talk with your health care  provider before you take any medicines that contain aspirin or NSAIDs, such as ibuprofen. These medicines increase your risk for dangerous bleeding. ? Take your medicine exactly as told, at the same time every day. ? Avoid activities that could cause injury or bruising, and follow instructions about how to prevent falls. ? Wear a medical alert bracelet or carry a card that lists what medicines you take.  Understand what foods and drugs interact with any medicines that you are taking. General instructions  Ask your health care provider when you may return to your normal activities. Avoid sitting or lying for a long time without moving.  Maintain a healthy weight. Ask your health care provider what weight is healthy for you.  Do not use any products that contain nicotine or tobacco, such as cigarettes, e-cigarettes, and chewing tobacco. If you need help quitting, ask your health care provider.  Talk with your health care provider about any travel plans. It is important to make sure that you are still able to take your medicine while traveling.  Keep all follow-up visits as told by your health care provider. This is important. Where to find more information  American Lung Association: www.lung.org  Centers for Disease Control and Prevention: www.cdc.gov Contact a health care provider if:  You missed a dose of your blood thinner medicine. Get help right away if you:  Have: ? New or increased pain, swelling, warmth, or redness in an arm or leg. ? Shortness of breath that gets worse during activity or at rest. ? A fever. ? Worsening chest pain. ? A rapid or irregular heartbeat. ? A severe headache. ? Vision changes. ? A serious fall or accident, or you hit your head. ? Stomach pain. ? Blood in your vomit, stool, or urine. ? A cut that will not stop bleeding.  Cough up blood.  Feel light-headed or dizzy, and that feeling does not go away.  Cannot move your arms or legs.  Are  confused or have memory loss. These symptoms may represent a serious problem that is an emergency. Do not wait to see if the symptoms will go away. Get medical help right away. Call your local emergency services (911 in the [place] Do not drive yourself to the hospital. Summary  A pulmonary embolism (PE) is a serious and potentially life-threatening condition, in which a blood clot from one part of the body (deep vein thrombosis, DVT) travels to the arteries of the lung, causing a sudden blockage or decrease of blood flow to the lungs. This may result in shortness of breath, chest pain, dizziness, and fainting.  Treatments for this condition usually include medicines to thin your blood (anticoagulants) or medicines to break apart blood clots (thrombolytics).  If you are given blood thinners, take your medicine exactly as told by your health care provider, at the same time every day. This is important.  Understand what foods and drugs interact with any medicines that you are taking.  If you have signs of PE or DVT, call your local emergency services (911 in the [place] This information is not   intended to replace advice given to you by your health care provider. Make sure you discuss any questions you have with your health care provider. Document Revised: 01/22/2019 Document Reviewed: 01/22/2019 Elsevier Patient Education  2021 [employer]  

## 2020-07-13 ENCOUNTER — Institutional Professional Consult (permissible substitution): Payer: Medicaid Other | Admitting: Pulmonary Disease

## 2020-09-19 ENCOUNTER — Institutional Professional Consult (permissible substitution): Payer: Medicaid Other | Admitting: Pulmonary Disease

## 2020-09-19 NOTE — Progress Notes (Deleted)
Synopsis: Referred in June 2022 for pleural effusion  Subjective:   PATIENT ID: Patrick Estrada GENDER: male DOB: Mar 27, 1960, MRN: [id number]   HPI  No chief complaint on file.   Patrick Estrada is a 60 year old male, former smoker with hypertension who is referred to pulmonary clinic for evaluation of pleural effusion.     Past Medical History:  Diagnosis Date   Arthritis    Asthma    Hypertension 10/12/2011     Family History  Problem Relation Age of Onset   Diabetes Mother    Heart disease Mother        CHF   Alcohol abuse Father    Diabetes Sister    Hypertension Sister    Alcohol abuse Brother    Drug abuse Son        suffered cardiac arrest playing bball after smoking "synthetic weed"   Arthritis Sister    Alcohol abuse Brother    Alcohol abuse Brother    Premature birth Son        not sure of cause of death     Social History   Socioeconomic History   Marital status: Single    Spouse name: Not on file   Number of children: 4   Years of education: GED   Highest education level: 12th grade  Occupational History   Occupation: unemployed    Comment: Has not worked in some time.  Tobacco Use   Smoking status: Former    Packs/day: 1.00    Years: 45.00    Pack years: 45.00    Types: Cigarettes   Smokeless tobacco: Never  Vaping Use   Vaping Use: Never used  Substance and Sexual Activity   Alcohol use: Yes    Alcohol/week: 3.0 standard drinks    Types: 3 Cans of beer per week    Comment: Drinks to excess daily:  beer, wine, liquor   Drug use: Not Currently    Types: "Crack" cocaine, Marijuana    Comment: No cocaine use for years. Uses marijuana "every now and then."   Sexual activity: Yes    Birth control/protection: Condom  Other Topics Concern   Not on file  Social History Narrative   Born [place] raised in [place]   [place] to [place] [place] as relatively young person   Has been in and out of prison, generally drug and alcohol related.   Lives  with his mother and eldest daughter, sometimes ex wife's house.   Social Determinants of Health   Financial Resource Strain: Not on file  Food Insecurity: Not on file  Transportation Needs: Not on file  Physical Activity: Not on file  Stress: Not on file  Social Connections: Not on file  Intimate Partner Violence: Not on file     Allergies  Allergen Reactions   Morphine And Related Itching and Rash    burning     Outpatient Medications Prior to Visit  Medication Sig Dispense Refill   albuterol (VENTOLIN HFA) 108 (90 Base) MCG/ACT inhaler Inhale 2 puffs into the lungs every 4 (four) hours as needed for wheezing or shortness of breath. 1 each 2   predniSONE (DELTASONE) 20 MG tablet 2 tabs po daily x 4 days 8 tablet 0   tamsulosin (FLOMAX) 0.4 MG CAPS capsule Take 1 capsule (0.4 mg total) by mouth daily. 30 capsule 3   No facility-administered medications prior to visit.    ROS    Objective:  There were no vitals filed  for this visit.   Physical Exam    CBC    Component Value Date/Time   WBC 4.1 05/26/2020 1110   RBC 4.78 05/26/2020 1110   HGB 15.1 05/26/2020 1110   HGB 15.5 01/28/2017 1547   HCT 44.9 05/26/2020 1110   HCT 47.3 01/28/2017 1547   PLT 171 05/26/2020 1110   PLT 206 01/28/2017 1547   MCV 93.9 05/26/2020 1110   MCV 99 (H) 01/28/2017 1547   MCH 31.6 05/26/2020 1110   MCHC 33.6 05/26/2020 1110   RDW 15.0 05/26/2020 1110   RDW 15.4 01/28/2017 1547   LYMPHSABS 0.9 11/19/2019 1744   LYMPHSABS 2.3 01/28/2017 1547   MONOABS 1.0 11/19/2019 1744   EOSABS 0.2 11/19/2019 1744   EOSABS 0.0 01/28/2017 1547   BASOSABS 0.0 11/19/2019 1744   BASOSABS 0.0 01/28/2017 1547     Chest imaging: CXR 05/26/20 Persistent moderate loculated LEFT hydrothorax (NOT pneumothorax) and LEFT basilar atelectasis.  CXR 11/19/2019 Chronic loculated left pleural effusion mildly improved. Mild left lower lobe atelectasis  CTA Chest 02/2018 1. Extensive patchy nodular  peribronchovascular consolidation in the left upper lobe, most prominent in the medial apical left upper lobe. Scattered tree-in-bud opacities in both upper lobes. Scattered subcentimeter subsolid pulmonary nodules in both lungs. These findings are all new since 06/21/2016 chest CT and appear to have been increasing on chest radiographs back to 10/27/2017. Appearance favors a chronic infectious or inflammatory process, with the differential including tuberculosis or atypical mycobacterial infection (MAI). Appearance is not typical of neoplasm, although an atypical process such as a aerogenous spread of adenocarcinoma is not entirely excluded. Pulmonology consultation advised for further management. Close post treatment chest CT follow-up advised. 2. Small to moderate loculated basilar left pleural effusion with associated smooth left pleural thickening. 3. No thoracic adenopathy.  CT Chest 2018 1. Minimally displaced posterior left eleventh rib fracture, with nondisplaced posterior left tenth rib fracture. Adjacent pleural thickening and atelectasis. No pneumothorax or significant hemothorax. 2. No additional acute traumatic injury to the chest, abdomen, or pelvis.  PFT: No flowsheet data found.  Labs:  Path:  Echo:  Heart Catheterization:       Assessment & Plan:   No diagnosis found.  Discussion: ***    Current Outpatient Medications:    albuterol (VENTOLIN HFA) 108 (90 Base) MCG/ACT inhaler, Inhale 2 puffs into the lungs every 4 (four) hours as needed for wheezing or shortness of breath., Disp: 1 each, Rfl: 2   predniSONE (DELTASONE) 20 MG tablet, 2 tabs po daily x 4 days, Disp: 8 tablet, Rfl: 0   tamsulosin (FLOMAX) 0.4 MG CAPS capsule, Take 1 capsule (0.4 mg total) by mouth daily., Disp: 30 capsule, Rfl: 3

## 2021-02-06 ENCOUNTER — Other Ambulatory Visit: Payer: Self-pay

## 2021-02-06 ENCOUNTER — Telehealth: Payer: Self-pay | Admitting: Pulmonary Disease

## 2021-02-06 ENCOUNTER — Emergency Department (HOSPITAL_COMMUNITY): Payer: Medicaid Other

## 2021-02-06 ENCOUNTER — Encounter (HOSPITAL_COMMUNITY): Payer: Self-pay

## 2021-02-06 ENCOUNTER — Emergency Department (HOSPITAL_COMMUNITY)
Admission: EM | Admit: 2021-02-06 | Discharge: 2021-02-07 | Disposition: A | Discharge status: Home / Self Care | Payer: Medicaid Other | Attending: Emergency Medicine | Admitting: Emergency Medicine

## 2021-02-06 DIAGNOSIS — R0602 Shortness of breath: Secondary | ICD-10-CM | POA: Diagnosis present

## 2021-02-06 DIAGNOSIS — Z5321 Procedure and treatment not carried out due to patient leaving prior to being seen by health care provider: Secondary | ICD-10-CM | POA: Insufficient documentation

## 2021-02-06 LAB — BASIC METABOLIC PANEL
Anion gap: 15 (ref 5–15)
BUN: 5 mg/dL — ABNORMAL LOW (ref 8–23)
CO2: 19 mmol/L — ABNORMAL LOW (ref 22–32)
Calcium: 8.8 mg/dL — ABNORMAL LOW (ref 8.9–10.3)
Chloride: 98 mmol/L (ref 98–111)
Creatinine, Ser: 0.77 mg/dL (ref 0.61–1.24)
GFR, Estimated: >60 mL/min (ref >60)
Glucose, Bld: 76 mg/dL (ref 70–99)
Potassium: 4 mmol/L (ref 3.5–5.1)
Sodium: 132 mmol/L — ABNORMAL LOW (ref 135–145)

## 2021-02-06 LAB — CBC
HCT: 46.6 % (ref 39.0–52.0)
Hemoglobin: 15.8 g/dL (ref 13.0–17.0)
MCH: 33 pg (ref 26.0–34.0)
MCHC: 33.9 g/dL (ref 30.0–36.0)
MCV: 97.3 fL (ref 80.0–100.0)
Platelets: 145 10*3/uL — ABNORMAL LOW (ref 150–400)
RBC: 4.79 MIL/uL (ref 4.22–5.81)
RDW: 13.4 % (ref 11.5–15.5)
WBC: 6.5 10*3/uL (ref 4.0–10.5)
nRBC: 0 % (ref 0.0–0.2)

## 2021-02-06 LAB — BRAIN NATRIURETIC PEPTIDE: B Natriuretic Peptide: 246.8 pg/mL — ABNORMAL HIGH (ref 0.0–100.0)

## 2021-02-06 MED ORDER — ALBUTEROL SULFATE HFA 108 (90 BASE) MCG/ACT IN AERS: 2.0000 | INHALATION_SPRAY | RESPIRATORY_TRACT | Status: DC | PRN

## 2021-02-06 NOTE — Telephone Encounter (Signed)
I have called the pt and he has not been seen in out office before.  He was advised to call his PCP for this.    He is coughing up green sputum and said that his chest rattles.  He denies any fever or any other symptom.  Pt voiced his understanding to call his PCP

## 2021-02-06 NOTE — ED Notes (Signed)
Pt called twice for recheck vitals, no answer

## 2021-02-06 NOTE — ED Notes (Signed)
PT WAS CALLED FOR VITALS AND NO RESPONSE

## 2021-02-06 NOTE — ED Triage Notes (Signed)
Pt reports feeling as if he needs fluid taken off his lungs. Pt endorses orthopnea. No swelling in feet or ankles per pt. Denies CP. No antidiuretic per pt. Pt states he has had two thoracentesis procedures in the past  for same issue. 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 No bloody sputum currently per pt.

## 2021-02-06 NOTE — ED Notes (Signed)
Pt called again for updated vitals and no response

## 2022-02-07 ENCOUNTER — Other Ambulatory Visit: Payer: Self-pay | Admitting: Family

## 2022-02-07 ENCOUNTER — Ambulatory Visit
Admission: RE | Admit: 2022-02-07 | Discharge: 2022-02-07 | Disposition: A | Discharge status: Home / Self Care | Payer: Medicaid Other | Source: Ambulatory Visit | Attending: Family | Admitting: Family

## 2022-02-07 DIAGNOSIS — R0609 Other forms of dyspnea: Secondary | ICD-10-CM

## 2023-09-10 ENCOUNTER — Other Ambulatory Visit: Payer: Self-pay

## 2023-09-10 DIAGNOSIS — E781 Pure hyperglyceridemia: Secondary | ICD-10-CM

## 2023-09-12 ENCOUNTER — Ambulatory Visit
Admission: RE | Admit: 2023-09-12 | Discharge: 2023-09-12 | Disposition: A | Discharge status: Home / Self Care | Source: Ambulatory Visit

## 2023-09-12 DIAGNOSIS — E781 Pure hyperglyceridemia: Secondary | ICD-10-CM

## 2023-09-25 ENCOUNTER — Encounter: Payer: Self-pay | Admitting: Gastroenterology

## 2023-10-21 ENCOUNTER — Ambulatory Visit (AMBULATORY_SURGERY_CENTER): Admitting: *Deleted

## 2023-10-21 VITALS — Ht 69.0 in | Wt 162.0 lb

## 2023-10-21 DIAGNOSIS — Z1211 Encounter for screening for malignant neoplasm of colon: Secondary | ICD-10-CM

## 2023-10-21 MED ORDER — NA SULFATE-K SULFATE-MG SULF 17.5-3.13-1.6 GM/177ML PO SOLN: 1.0000 | Freq: Once | ORAL | 0 refills | Status: AC

## 2023-10-21 NOTE — Progress Notes (Signed)
 Pt's name and DOB verified at the beginning of the pre-visit wit 2 identifiers   Pt denies any difficulty with ambulating,sitting, laying down or rolling side to side  Pt has no issues moving head neck or swallowing  No egg or soy allergy known to patient   No issues known to pt with past sedation with any surgeries or procedures  Patient denies ever being intubated  No FH of Malignant Hyperthermia  Pt is not on home 02   Pt is not on blood thinners   Pt denies issues with constipation    Pt is not on dialysis  Pt denise any abnormal heart rhythms   Pt denies any upcoming cardiac testing  Patient's chart reviewed by Norleen Schillings CNRA prior to pre-visit and patient appropriate for the [facility].  Pre-visit completed and red dot placed by patient's name on their procedure day (on provider's schedule).    Visit by phone  Pt states weight is 162 lb  IInstructions reviewed. Pt given  both [facility] main # and MD on call # prior to instructions.  Pt states understanding of instructions. Instructed pt to review instructions again prior to procedure and call main # given if has questions.. Pt states they will.   Instructed pt on where to find instructions on My Chart.

## 2023-11-13 ENCOUNTER — Telehealth: Payer: Self-pay | Admitting: Gastroenterology

## 2023-11-13 ENCOUNTER — Encounter: Admitting: Gastroenterology

## 2023-11-13 NOTE — Telephone Encounter (Signed)
 New prep instructions will be mailed to address on file. Patient does not have a mychart account.

## 2023-11-13 NOTE — Telephone Encounter (Signed)
 Inbound call from patient stating he read wrong paperwork and missed colonoscopy that was scheduled for today, patient was told to reschedule appointment so he called and we rescheduled him for 9/5. Patient had his pre visit on 10/21/23  He has prep medication just needs new prep instructions   Please advise  Thank you

## 2023-11-13 NOTE — Telephone Encounter (Signed)
 Good Morning Dr. San,  I called this patient this morning  at 10:45 to see if he was coming for his procedure.  He stated the paperwork he has shows the date of Nov 18, 2023.  I checked what was in the system for his letter and it does show the 11-13-23.    He said when he picked up his prep the paperwork showed 26th of August? I advised him not to show up on the 26th since he did not have a procedure listed for that day on our schedule.  He stated he would call back to reschedule procedure.   Please advise if you would like me to NO SHOW this  patient or cancel.

## 2023-11-28 ENCOUNTER — Ambulatory Visit (AMBULATORY_SURGERY_CENTER): Admitting: Gastroenterology

## 2023-11-28 ENCOUNTER — Encounter: Payer: Self-pay | Admitting: Gastroenterology

## 2023-11-28 VITALS — BP 135/93 | HR 67 | Temp 98.2°F | Resp 20

## 2023-11-28 DIAGNOSIS — K648 Other hemorrhoids: Secondary | ICD-10-CM

## 2023-11-28 DIAGNOSIS — Z1211 Encounter for screening for malignant neoplasm of colon: Secondary | ICD-10-CM

## 2023-11-28 DIAGNOSIS — D128 Benign neoplasm of rectum: Secondary | ICD-10-CM

## 2023-11-28 DIAGNOSIS — K5730 Diverticulosis of large intestine without perforation or abscess without bleeding: Secondary | ICD-10-CM

## 2023-11-28 DIAGNOSIS — K635 Polyp of colon: Secondary | ICD-10-CM | POA: Diagnosis not present

## 2023-11-28 DIAGNOSIS — D122 Benign neoplasm of ascending colon: Secondary | ICD-10-CM

## 2023-11-28 DIAGNOSIS — K641 Second degree hemorrhoids: Secondary | ICD-10-CM

## 2023-11-28 MED ORDER — SODIUM CHLORIDE 0.9 % IV SOLN: 500.0000 mL | Freq: Once | INTRAVENOUS | Status: DC

## 2023-11-28 NOTE — Progress Notes (Signed)
 GASTROENTEROLOGY PROCEDURE H&P NOTE   Primary Care Physician: Patient, No Pcp Per    Reason for Procedure:  Colon Cancer screening  Plan:    Colonoscopy  Patient is appropriate for endoscopic procedure(s) in the ambulatory ([facility]) setting.  The nature of the procedure, as well as the risks, benefits, and alternatives were carefully and thoroughly reviewed with the patient. Ample time for discussion and questions allowed. The patient understood, was satisfied, and agreed to proceed.     HPI: Patrick Estrada is a 63 y.o. male who presents for colonoscopy for routine Colon Cancer screening.  No active GI symptoms.  No known family history of colon cancer or related malignancy.  Patient is otherwise without complaints or active issues today.  Past Medical History:  Diagnosis Date   Arthritis    Asthma    Hypertension 10/12/2011   Tuberculosis     Past Surgical History:  Procedure Laterality Date   HERNIA REPAIR Left Teens   IR THORACENTESIS ASP PLEURAL SPACE W/IMG GUIDE  02/16/2018   Right middle finger repair Right     Prior to Admission medications   Medication Sig Start Date End Date Taking? Authorizing Provider  albuterol  (VENTOLIN  HFA) 108 (90 Base) MCG/ACT inhaler Inhale 2 puffs into the lungs every 4 (four) hours as needed for wheezing or shortness of breath. 06/05/20   Celestia Rosaline SQUIBB, NP  gabapentin (NEURONTIN) 300 MG capsule Take by mouth 2 (two) times daily.    [provider]  predniSONE  (DELTASONE ) 20 MG tablet 2 tabs po daily x 4 days Patient not taking: Reported on 10/21/2023 05/27/20   Freddi Hamilton, MD  tamsulosin  (FLOMAX ) 0.4 MG CAPS capsule Take 1 capsule (0.4 mg total) by mouth daily. Patient not taking: Reported on 10/21/2023 03/03/18   Kristy Sharolyn Lenis, PA-C    Current Outpatient Medications  Medication Sig Dispense Refill   albuterol  (VENTOLIN  HFA) 108 (90 Base) MCG/ACT inhaler Inhale 2 puffs into the lungs every 4 (four) hours as needed  for wheezing or shortness of breath. 1 each 2   gabapentin (NEURONTIN) 300 MG capsule Take by mouth 2 (two) times daily.     predniSONE  (DELTASONE ) 20 MG tablet 2 tabs po daily x 4 days (Patient not taking: Reported on 10/21/2023) 8 tablet 0   tamsulosin  (FLOMAX ) 0.4 MG CAPS capsule Take 1 capsule (0.4 mg total) by mouth daily. (Patient not taking: Reported on 10/21/2023) 30 capsule 3   Current Facility-Administered Medications  Medication Dose Route Frequency Provider Last Rate Last Admin   0.9 %  sodium chloride  infusion  500 mL Intravenous Once Moyinoluwa Dawe V, DO        Allergies as of 11/28/2023 - Review Complete 11/28/2023  Allergen Reaction Noted   Morphine  and codeine Itching and Rash 04/30/2011    Family History  Problem Relation Age of Onset   Diabetes Mother    Heart disease Mother        CHF   Alcohol  abuse Father    Diabetes Sister    Hypertension Sister    Arthritis Sister    Alcohol  abuse Brother    Alcohol  abuse Brother    Alcohol  abuse Brother    Drug abuse Son        suffered cardiac arrest playing bball after smoking synthetic weed   Premature birth Son        not sure of cause of death   Colon cancer Neg Hx    Colon polyps Neg Hx  Esophageal cancer Neg Hx    Rectal cancer Neg Hx    Stomach cancer Neg Hx     Social History   Socioeconomic History   Marital status: Single    Spouse name: Not on file   Number of children: 4   Years of education: GED   Highest education level: 12th grade  Occupational History   Occupation: unemployed    Comment: Has not worked in some time.  Tobacco Use   Smoking status: Former    Current packs/day: 1.00    Average packs/day: 1 pack/day for 45.0 years (45.0 ttl pk-yrs)    Types: Cigarettes   Smokeless tobacco: Never  Vaping Use   Vaping status: Never Used  Substance and Sexual Activity   Alcohol  use: Yes    Alcohol /week: 3.0 standard drinks of alcohol     Types: 3 Cans of beer per week    Comment:  Drinks to excess daily:  beer, wine, liquor   Drug use: Not Currently    Types: Crack cocaine, Marijuana    Comment: No cocaine use for years. Uses marijuana every now and then.   Sexual activity: Yes    Birth control/protection: Condom  Other Topics Concern   Not on file  Social History Narrative   Born [place] raised in [place]   [place] to [place] as relatively young person   Has been in and out of prison, generally drug and alcohol  related.   Lives with his mother and eldest daughter, sometimes ex wife's house.   Social Drivers of Health   Financial Resource Strain: At Risk (09/03/2023)   Received from [employer]    How hard is it for you to pay for the very basics like food, housing, heating, medical care, and medications?: 2  Food Insecurity: At Risk (09/03/2023)   Received from [facility] Insecurity    Within the past 12 months, you worried that your food would run out before you got money to buy more.: 2  Transportation Needs: Not at Risk (09/03/2023)   Received from [facility] Needs    In the past 12 months, has lack of transportation kept you from medical appointments, meetings, work or from getting things needed for daily living?: 1  Physical Activity: Not on File (07/12/2021)   Received from [facility]   Physical Activity    Physical Activity: 0  Stress: Not on File (07/12/2021)   Received from [facility]   Stress    Stress: 0  Social Connections: Not on File (12/07/2022)   Received from [employer] [facility]   Social Connections    Connectedness: 0  Intimate Partner Violence: Not on file    Physical Exam: Vital signs in last 24 hours: @BP  (!) 160/96   Pulse 83   Temp 98.2 F (36.8 C) (Temporal)   SpO2 100%  GEN: NAD EYE: Sclerae anicteric ENT: MMM CV: Non-tachycardic Pulm: CTA b/l GI: Soft, NT/ND NEURO:  Alert & Oriented x 3   Sandor Flatter, DO [facility]   11/28/2023 2:59 PM

## 2023-11-28 NOTE — Op Note (Signed)
 [facility] Patient Name: Patrick Estrada Procedure Date: 11/28/2023 2:09 PM MRN: [id number] Endoscopist: Sandor Flatter , MD, [id number] Age: 63 Referring MD:  Date of Birth: 09-20-1960 Gender: Male Account #: [id number] Procedure:                Colonoscopy Indications:              Screening for colorectal malignant neoplasm, This                            is the patient's first colonoscopy Medicines:                Monitored Anesthesia Care Procedure:                Pre-Anesthesia Assessment:                           - Prior to the procedure, a History and Physical                            was performed, and patient medications and                            allergies were reviewed. The patient's tolerance of                            previous anesthesia was also reviewed. The risks                            and benefits of the procedure and the sedation                            options and risks were discussed with the patient.                            All questions were answered, and informed consent                            was obtained. Prior Anticoagulants: The patient has                            taken no anticoagulant or antiplatelet agents. ASA                            Grade Assessment: III - A patient with severe                            systemic disease. After reviewing the risks and                            benefits, the patient was deemed in satisfactory                            condition to undergo the procedure.  After obtaining informed consent, the colonoscope                            was passed under direct vision. Throughout the                            procedure, the patient's blood pressure, pulse, and                            oxygen saturations were monitored continuously. The                            CF [id number] #[id number] was introduced through the anus                            and advanced to the the  cecum, identified by                            appendiceal orifice and ileocecal valve. The                            colonoscopy was performed without difficulty. The                            patient tolerated the procedure well. The quality                            of the bowel preparation was fair. The ileocecal                            valve, appendiceal orifice, and rectum were                            photographed. Scope In: 3:05:00 PM Scope Out: 3:17:45 PM Scope Withdrawal Time: 0 hours 10 minutes 51 seconds  Total Procedure Duration: 0 hours 12 minutes 45 seconds  Findings:                 The perianal and digital rectal examinations were                            normal.                           Two sessile polyps were found in the ascending                            colon. The polyps were 3 to 5 mm in size. These                            polyps were removed with a cold snare. Resection                            and retrieval were complete. Estimated blood loss  was minimal.                           A 5 mm polyp was found in the rectum. The polyp was                            sessile. The polyp was removed with a cold snare.                            Resection and retrieval were complete. Estimated                            blood loss was minimal.                           Non-bleeding internal hemorrhoids were found during                            retroflexion. The hemorrhoids were small.                           A moderate amount of solid stool and solid food                            debris was found scattered throughout the colon,                            interfering with visualization. Lavage of the area                            was performed using copious amounts of sterile                            water, resulting in incomplete clearance with fair                            visualization.                           A  few small-mouthed diverticula were found in the                            ascending colon. Complications:            No immediate complications. Estimated Blood Loss:     Estimated blood loss was minimal. Impression:               - Preparation of the colon was fair.                           - Two 3 to 5 mm polyps in the ascending colon,                            removed with a cold snare. Resected and retrieved.                           -  One 5 mm polyp in the rectum, removed with a cold                            snare. Resected and retrieved.                           - Non-bleeding internal hemorrhoids.                           - Stool in the entire examined colon.                           - Diverticulosis in the ascending colon. Recommendation:           - Patient has a contact number available for                            emergencies. The signs and symptoms of potential                            delayed complications were discussed with the                            patient. Return to normal activities tomorrow.                            Written discharge instructions were provided to the                            patient.                           - Resume previous diet.                           - Continue present medications.                           - Await pathology results.                           - Repeat colonoscopy in 2 years because the bowel                            preparation was suboptimal and for surveillance.                           - Return to GI office PRN. Sandor Flatter, MD 11/28/2023 3:25:19 PM

## 2023-11-28 NOTE — Patient Instructions (Signed)
 Resume previous diet Continue present medications Await pathology results Return in 2 years for your next colonoscopy due to poor prep Return to GI office as needed  Handouts/information given for polyps, diverticulosis and hemorrhoids  YOU HAD AN ENDOSCOPIC PROCEDURE TODAY AT THE [facility]:   Refer to the procedure report that was given to you for any specific questions about what was found during the examination.  If the procedure report does not answer your questions, please call your gastroenterologist to clarify.  If you requested that your care partner not be given the details of your procedure findings, then the procedure report has been included in a sealed envelope for you to review at your convenience later.  YOU SHOULD EXPECT: Some feelings of bloating in the abdomen. Passage of more gas than usual.  Walking can help get rid of the air that was put into your GI tract during the procedure and reduce the bloating. If you had a lower endoscopy (such as a colonoscopy or flexible sigmoidoscopy) you may notice spotting of blood in your stool or on the toilet paper. If you underwent a bowel prep for your procedure, you may not have a normal bowel movement for a few days.  Please Note:  You might notice some irritation and congestion in your nose or some drainage.  This is from the oxygen used during your procedure.  There is no need for concern and it should clear up in a day or so.  SYMPTOMS TO REPORT IMMEDIATELY:  Following lower endoscopy (colonoscopy):  Excessive amounts of blood in the stool  Significant tenderness or worsening of abdominal pains  Swelling of the abdomen that is new, acute  Fever of 100F or higher For urgent or emergent issues, a gastroenterologist can be reached at any hour by calling [phone]. Do not use MyChart messaging for urgent concerns.   DIET:  We do recommend a small meal at first, but then you may proceed to your regular diet.  Drink  plenty of fluids but you should avoid alcoholic beverages for 24 hours.  ACTIVITY:  You should plan to take it easy for the rest of today and you should NOT DRIVE or use heavy machinery until tomorrow (because of the sedation medicines used during the test).    FOLLOW UP: Our staff will call the number listed on your records the next business day following your procedure.  We will call around 7:15- 8:00 am to check on you and address any questions or concerns that you may have regarding the information given to you following your procedure. If we do not reach you, we will leave a message.     If any biopsies were taken you will be contacted by phone or by letter within the next 1-3 weeks.  Please call us  at [phone] if you have not heard about the biopsies in 3 weeks.   SIGNATURES/CONFIDENTIALITY: You and/or your care partner have signed paperwork which will be entered into your electronic medical record.  These signatures attest to the fact that that the information above on your After Visit Summary has been reviewed and is understood.  Full responsibility of the confidentiality of this discharge information lies with you and/or your care-partner.

## 2023-11-28 NOTE — Progress Notes (Signed)
 Pt's states no medical or surgical changes since previsit or office visit.  Patient reports he ate oodles and noodles on 11/27/2023 at 9:30pm. Patient reports his stool is liquid clear.  Dr. San notified. Per Dr. San okay to proceed as long as his last bowel movement was clear.

## 2023-12-01 ENCOUNTER — Telehealth: Payer: Self-pay

## 2023-12-01 NOTE — Telephone Encounter (Signed)
 Attempted to reach patient for post-procedure f/u call. No answer, mailbox is full and unable to leave message.

## 2023-12-03 ENCOUNTER — Ambulatory Visit: Payer: Self-pay | Admitting: Gastroenterology

## 2023-12-03 LAB — SURGICAL PATHOLOGY

## 2023-12-04 ENCOUNTER — Other Ambulatory Visit: Payer: Self-pay | Admitting: Family

## 2023-12-04 DIAGNOSIS — F17200 Nicotine dependence, unspecified, uncomplicated: Secondary | ICD-10-CM

## 2023-12-04 DIAGNOSIS — Z122 Encounter for screening for malignant neoplasm of respiratory organs: Secondary | ICD-10-CM

## 2023-12-10 ENCOUNTER — Encounter: Payer: Self-pay | Admitting: Family

## 2023-12-11 ENCOUNTER — Ambulatory Visit
Admission: RE | Admit: 2023-12-11 | Discharge: 2023-12-11 | Disposition: A | Discharge status: Home / Self Care | Source: Ambulatory Visit | Attending: Family | Admitting: Family

## 2023-12-11 DIAGNOSIS — F17200 Nicotine dependence, unspecified, uncomplicated: Secondary | ICD-10-CM

## 2023-12-11 DIAGNOSIS — Z122 Encounter for screening for malignant neoplasm of respiratory organs: Secondary | ICD-10-CM
